# Patient Record
Sex: Male | Born: 1985 | Race: Black or African American | Hispanic: No
Health system: Southern US, Community
[De-identification: ages and names within clinical notes are randomized; demographics above are authoritative.]

---

## 2019-04-01 DIAGNOSIS — S066X9A Traumatic subarachnoid hemorrhage with loss of consciousness of unspecified duration, initial encounter: Secondary | ICD-10-CM | POA: Diagnosis not present

## 2019-04-01 DIAGNOSIS — S020XXA Fracture of vault of skull, initial encounter for closed fracture: Secondary | ICD-10-CM | POA: Diagnosis present

## 2019-04-01 DIAGNOSIS — I609 Nontraumatic subarachnoid hemorrhage, unspecified: Secondary | ICD-10-CM | POA: Diagnosis not present

## 2019-04-01 DIAGNOSIS — S0101XA Laceration without foreign body of scalp, initial encounter: Secondary | ICD-10-CM | POA: Diagnosis present

## 2019-04-01 DIAGNOSIS — S270XXA Traumatic pneumothorax, initial encounter: Secondary | ICD-10-CM | POA: Diagnosis present

## 2019-04-01 DIAGNOSIS — S2242XA Multiple fractures of ribs, left side, initial encounter for closed fracture: Secondary | ICD-10-CM | POA: Diagnosis present

## 2019-04-01 DIAGNOSIS — E875 Hyperkalemia: Secondary | ICD-10-CM | POA: Diagnosis not present

## 2019-04-01 DIAGNOSIS — Z20822 Contact with and (suspected) exposure to covid-19: Secondary | ICD-10-CM | POA: Diagnosis present

## 2019-04-01 DIAGNOSIS — R402213 Coma scale, best verbal response, none, at hospital admission: Secondary | ICD-10-CM | POA: Diagnosis not present

## 2019-04-01 DIAGNOSIS — Z915 Personal history of self-harm: Secondary | ICD-10-CM

## 2019-04-01 DIAGNOSIS — F430 Acute stress reaction: Secondary | ICD-10-CM | POA: Diagnosis present

## 2019-04-01 DIAGNOSIS — S0285XA Fracture of orbit, unspecified, initial encounter for closed fracture: Secondary | ICD-10-CM | POA: Diagnosis present

## 2019-04-01 DIAGNOSIS — M79672 Pain in left foot: Secondary | ICD-10-CM | POA: Diagnosis not present

## 2019-04-01 DIAGNOSIS — R1310 Dysphagia, unspecified: Secondary | ICD-10-CM | POA: Diagnosis present

## 2019-04-01 DIAGNOSIS — R402133 Coma scale, eyes open, to sound, at hospital admission: Secondary | ICD-10-CM | POA: Diagnosis not present

## 2019-04-01 DIAGNOSIS — S36039A Unspecified laceration of spleen, initial encounter: Secondary | ICD-10-CM | POA: Diagnosis present

## 2019-04-01 DIAGNOSIS — Y92230 Patient room in hospital as the place of occurrence of the external cause: Secondary | ICD-10-CM | POA: Diagnosis not present

## 2019-04-01 DIAGNOSIS — S0240DA Maxillary fracture, left side, initial encounter for closed fracture: Secondary | ICD-10-CM | POA: Diagnosis present

## 2019-04-01 DIAGNOSIS — S25412A Minor laceration of left pulmonary blood vessels, initial encounter: Secondary | ICD-10-CM | POA: Diagnosis present

## 2019-04-01 DIAGNOSIS — R440 Auditory hallucinations: Secondary | ICD-10-CM | POA: Diagnosis present

## 2019-04-01 DIAGNOSIS — S32302A Unspecified fracture of left ilium, initial encounter for closed fracture: Secondary | ICD-10-CM | POA: Diagnosis present

## 2019-04-01 DIAGNOSIS — D62 Acute posthemorrhagic anemia: Secondary | ICD-10-CM | POA: Diagnosis present

## 2019-04-01 DIAGNOSIS — R402412 Glasgow coma scale score 13-15, at arrival to emergency department: Secondary | ICD-10-CM | POA: Diagnosis present

## 2019-04-01 DIAGNOSIS — S27339A Laceration of lung, unspecified, initial encounter: Secondary | ICD-10-CM | POA: Diagnosis present

## 2019-04-01 DIAGNOSIS — W1830XA Fall on same level, unspecified, initial encounter: Secondary | ICD-10-CM | POA: Diagnosis not present

## 2019-04-01 DIAGNOSIS — G9389 Other specified disorders of brain: Secondary | ICD-10-CM | POA: Diagnosis present

## 2019-04-01 DIAGNOSIS — S27321A Contusion of lung, unilateral, initial encounter: Secondary | ICD-10-CM | POA: Diagnosis present

## 2019-04-01 DIAGNOSIS — M542 Cervicalgia: Secondary | ICD-10-CM | POA: Diagnosis not present

## 2019-04-01 DIAGNOSIS — E876 Hypokalemia: Secondary | ICD-10-CM | POA: Diagnosis not present

## 2019-04-01 DIAGNOSIS — S0240FA Zygomatic fracture, left side, initial encounter for closed fracture: Secondary | ICD-10-CM | POA: Diagnosis present

## 2019-04-01 DIAGNOSIS — R451 Restlessness and agitation: Secondary | ICD-10-CM | POA: Diagnosis not present

## 2019-04-01 DIAGNOSIS — S52302A Unspecified fracture of shaft of left radius, initial encounter for closed fracture: Secondary | ICD-10-CM | POA: Diagnosis present

## 2019-04-01 DIAGNOSIS — S42022A Displaced fracture of shaft of left clavicle, initial encounter for closed fracture: Secondary | ICD-10-CM | POA: Diagnosis present

## 2019-04-01 DIAGNOSIS — R339 Retention of urine, unspecified: Secondary | ICD-10-CM | POA: Diagnosis not present

## 2019-04-01 DIAGNOSIS — R402353 Coma scale, best motor response, localizes pain, at hospital admission: Secondary | ICD-10-CM | POA: Diagnosis not present

## 2019-04-02 ENCOUNTER — Other Ambulatory Visit: Payer: Self-pay

## 2019-04-02 ENCOUNTER — Emergency Department (HOSPITAL_COMMUNITY): Payer: No Typology Code available for payment source

## 2019-04-02 ENCOUNTER — Inpatient Hospital Stay: Payer: Self-pay

## 2019-04-02 ENCOUNTER — Encounter (HOSPITAL_COMMUNITY): Payer: Self-pay | Admitting: Emergency Medicine

## 2019-04-02 ENCOUNTER — Inpatient Hospital Stay (HOSPITAL_COMMUNITY)
Admission: EM | Admit: 2019-04-02 | Discharge: 2019-05-03 | DRG: 958 | Disposition: A | Discharge status: Home / Self Care | Payer: No Typology Code available for payment source | Attending: Surgery | Admitting: Surgery

## 2019-04-02 ENCOUNTER — Inpatient Hospital Stay (HOSPITAL_COMMUNITY): Payer: No Typology Code available for payment source

## 2019-04-02 DIAGNOSIS — Y92230 Patient room in hospital as the place of occurrence of the external cause: Secondary | ICD-10-CM | POA: Diagnosis not present

## 2019-04-02 DIAGNOSIS — S06369A Traumatic hemorrhage of cerebrum, unspecified, with loss of consciousness of unspecified duration, initial encounter: Secondary | ICD-10-CM

## 2019-04-02 DIAGNOSIS — R52 Pain, unspecified: Secondary | ICD-10-CM

## 2019-04-02 DIAGNOSIS — Z419 Encounter for procedure for purposes other than remedying health state, unspecified: Secondary | ICD-10-CM

## 2019-04-02 DIAGNOSIS — W19XXXA Unspecified fall, initial encounter: Secondary | ICD-10-CM

## 2019-04-02 DIAGNOSIS — S020XXA Fracture of vault of skull, initial encounter for closed fracture: Secondary | ICD-10-CM | POA: Diagnosis present

## 2019-04-02 DIAGNOSIS — S25412A Minor laceration of left pulmonary blood vessels, initial encounter: Secondary | ICD-10-CM | POA: Diagnosis present

## 2019-04-02 DIAGNOSIS — G9389 Other specified disorders of brain: Secondary | ICD-10-CM | POA: Diagnosis present

## 2019-04-02 DIAGNOSIS — D62 Acute posthemorrhagic anemia: Secondary | ICD-10-CM | POA: Diagnosis present

## 2019-04-02 DIAGNOSIS — S270XXA Traumatic pneumothorax, initial encounter: Secondary | ICD-10-CM | POA: Diagnosis present

## 2019-04-02 DIAGNOSIS — Z0189 Encounter for other specified special examinations: Secondary | ICD-10-CM

## 2019-04-02 DIAGNOSIS — S32302A Unspecified fracture of left ilium, initial encounter for closed fracture: Secondary | ICD-10-CM | POA: Diagnosis present

## 2019-04-02 DIAGNOSIS — S066X9A Traumatic subarachnoid hemorrhage with loss of consciousness of unspecified duration, initial encounter: Secondary | ICD-10-CM | POA: Diagnosis present

## 2019-04-02 DIAGNOSIS — S36039A Unspecified laceration of spleen, initial encounter: Secondary | ICD-10-CM | POA: Diagnosis present

## 2019-04-02 DIAGNOSIS — R402133 Coma scale, eyes open, to sound, at hospital admission: Secondary | ICD-10-CM | POA: Diagnosis not present

## 2019-04-02 DIAGNOSIS — I619 Nontraumatic intracerebral hemorrhage, unspecified: Secondary | ICD-10-CM | POA: Diagnosis present

## 2019-04-02 DIAGNOSIS — S42002A Fracture of unspecified part of left clavicle, initial encounter for closed fracture: Secondary | ICD-10-CM

## 2019-04-02 DIAGNOSIS — S32309A Unspecified fracture of unspecified ilium, initial encounter for closed fracture: Secondary | ICD-10-CM

## 2019-04-02 DIAGNOSIS — I609 Nontraumatic subarachnoid hemorrhage, unspecified: Secondary | ICD-10-CM | POA: Diagnosis present

## 2019-04-02 DIAGNOSIS — Z789 Other specified health status: Secondary | ICD-10-CM

## 2019-04-02 DIAGNOSIS — R1310 Dysphagia, unspecified: Secondary | ICD-10-CM | POA: Diagnosis present

## 2019-04-02 DIAGNOSIS — Z915 Personal history of self-harm: Secondary | ICD-10-CM | POA: Diagnosis not present

## 2019-04-02 DIAGNOSIS — R339 Retention of urine, unspecified: Secondary | ICD-10-CM | POA: Diagnosis not present

## 2019-04-02 DIAGNOSIS — Z20822 Contact with and (suspected) exposure to covid-19: Secondary | ICD-10-CM | POA: Diagnosis present

## 2019-04-02 DIAGNOSIS — S0101XA Laceration without foreign body of scalp, initial encounter: Secondary | ICD-10-CM | POA: Diagnosis present

## 2019-04-02 DIAGNOSIS — R440 Auditory hallucinations: Secondary | ICD-10-CM | POA: Diagnosis present

## 2019-04-02 DIAGNOSIS — S0285XA Fracture of orbit, unspecified, initial encounter for closed fracture: Secondary | ICD-10-CM | POA: Diagnosis present

## 2019-04-02 DIAGNOSIS — M79671 Pain in right foot: Secondary | ICD-10-CM

## 2019-04-02 DIAGNOSIS — S5290XA Unspecified fracture of unspecified forearm, initial encounter for closed fracture: Secondary | ICD-10-CM

## 2019-04-02 DIAGNOSIS — S5292XA Unspecified fracture of left forearm, initial encounter for closed fracture: Secondary | ICD-10-CM

## 2019-04-02 DIAGNOSIS — S27339A Laceration of lung, unspecified, initial encounter: Secondary | ICD-10-CM | POA: Diagnosis present

## 2019-04-02 DIAGNOSIS — S25409A Unspecified injury of unspecified pulmonary blood vessels, initial encounter: Secondary | ICD-10-CM

## 2019-04-02 DIAGNOSIS — R402412 Glasgow coma scale score 13-15, at arrival to emergency department: Secondary | ICD-10-CM | POA: Diagnosis present

## 2019-04-02 DIAGNOSIS — R402353 Coma scale, best motor response, localizes pain, at hospital admission: Secondary | ICD-10-CM | POA: Diagnosis not present

## 2019-04-02 DIAGNOSIS — S299XXA Unspecified injury of thorax, initial encounter: Secondary | ICD-10-CM

## 2019-04-02 DIAGNOSIS — S27321A Contusion of lung, unilateral, initial encounter: Secondary | ICD-10-CM | POA: Diagnosis present

## 2019-04-02 DIAGNOSIS — S2242XA Multiple fractures of ribs, left side, initial encounter for closed fracture: Secondary | ICD-10-CM | POA: Diagnosis present

## 2019-04-02 DIAGNOSIS — J939 Pneumothorax, unspecified: Secondary | ICD-10-CM

## 2019-04-02 DIAGNOSIS — S0240FA Zygomatic fracture, left side, initial encounter for closed fracture: Secondary | ICD-10-CM | POA: Diagnosis present

## 2019-04-02 DIAGNOSIS — S52302A Unspecified fracture of shaft of left radius, initial encounter for closed fracture: Secondary | ICD-10-CM | POA: Diagnosis present

## 2019-04-02 DIAGNOSIS — W1830XA Fall on same level, unspecified, initial encounter: Secondary | ICD-10-CM | POA: Diagnosis not present

## 2019-04-02 DIAGNOSIS — S0240DA Maxillary fracture, left side, initial encounter for closed fracture: Secondary | ICD-10-CM | POA: Diagnosis present

## 2019-04-02 DIAGNOSIS — F430 Acute stress reaction: Secondary | ICD-10-CM

## 2019-04-02 HISTORY — PX: IR EMBO ART  VEN HEMORR LYMPH EXTRAV  INC GUIDE ROADMAPPING: IMG5450

## 2019-04-02 HISTORY — PX: IR ANGIOGRAM SELECTIVE EACH ADDITIONAL VESSEL: IMG667

## 2019-04-02 HISTORY — PX: IR US GUIDE VASC ACCESS RIGHT: IMG2390

## 2019-04-02 HISTORY — PX: IR ANGIOGRAM VISCERAL SELECTIVE: IMG657

## 2019-04-02 LAB — CBC
HCT: 20.4 % — ABNORMAL LOW (ref 39.0–52.0)
HCT: 22.7 % — ABNORMAL LOW (ref 39.0–52.0)
HCT: 33.4 % — ABNORMAL LOW (ref 39.0–52.0)
Hemoglobin: 11.1 g/dL — ABNORMAL LOW (ref 13.0–17.0)
Hemoglobin: 7.1 g/dL — ABNORMAL LOW (ref 13.0–17.0)
Hemoglobin: 7.7 g/dL — ABNORMAL LOW (ref 13.0–17.0)
MCH: 28 pg (ref 26.0–34.0)
MCH: 28 pg (ref 26.0–34.0)
MCH: 28.1 pg (ref 26.0–34.0)
MCHC: 33.2 g/dL (ref 30.0–36.0)
MCHC: 33.9 g/dL (ref 30.0–36.0)
MCHC: 34.8 g/dL (ref 30.0–36.0)
MCV: 80.6 fL (ref 80.0–100.0)
MCV: 82.5 fL (ref 80.0–100.0)
MCV: 84.3 fL (ref 80.0–100.0)
Platelets: 162 10*3/uL (ref 150–400)
Platelets: 172 10*3/uL (ref 150–400)
Platelets: 360 10*3/uL (ref 150–400)
RBC: 2.53 MIL/uL — ABNORMAL LOW (ref 4.22–5.81)
RBC: 2.75 MIL/uL — ABNORMAL LOW (ref 4.22–5.81)
RBC: 3.96 MIL/uL — ABNORMAL LOW (ref 4.22–5.81)
RDW: 13.2 % (ref 11.5–15.5)
RDW: 13.6 % (ref 11.5–15.5)
RDW: 13.8 % (ref 11.5–15.5)
WBC: 10.7 10*3/uL — ABNORMAL HIGH (ref 4.0–10.5)
WBC: 10.8 10*3/uL — ABNORMAL HIGH (ref 4.0–10.5)
WBC: 12.7 10*3/uL — ABNORMAL HIGH (ref 4.0–10.5)
nRBC: 0 % (ref 0.0–0.2)
nRBC: 0 % (ref 0.0–0.2)
nRBC: 0 % (ref 0.0–0.2)

## 2019-04-02 LAB — POCT I-STAT 7, (LYTES, BLD GAS, ICA,H+H)
Acid-base deficit: 2 mmol/L (ref 0.0–2.0)
Bicarbonate: 25.2 mmol/L (ref 20.0–28.0)
Calcium, Ion: 1.2 mmol/L (ref 1.15–1.40)
HCT: 30 % — ABNORMAL LOW (ref 39.0–52.0)
Hemoglobin: 10.2 g/dL — ABNORMAL LOW (ref 13.0–17.0)
O2 Saturation: 99 %
Patient temperature: 98.6
Potassium: 3.3 mmol/L — ABNORMAL LOW (ref 3.5–5.1)
Sodium: 141 mmol/L (ref 135–145)
TCO2: 27 mmol/L (ref 22–32)
pCO2 arterial: 52 mmHg — ABNORMAL HIGH (ref 32.0–48.0)
pH, Arterial: 7.293 — ABNORMAL LOW (ref 7.350–7.450)
pO2, Arterial: 175 mmHg — ABNORMAL HIGH (ref 83.0–108.0)

## 2019-04-02 LAB — COMPREHENSIVE METABOLIC PANEL
ALT: 94 U/L — ABNORMAL HIGH (ref 0–44)
AST: 242 U/L — ABNORMAL HIGH (ref 15–41)
Albumin: 3.4 g/dL — ABNORMAL LOW (ref 3.5–5.0)
Alkaline Phosphatase: 71 U/L (ref 38–126)
Anion gap: 11 (ref 5–15)
BUN: 17 mg/dL (ref 6–20)
CO2: 22 mmol/L (ref 22–32)
Calcium: 8.5 mg/dL — ABNORMAL LOW (ref 8.9–10.3)
Chloride: 105 mmol/L (ref 98–111)
Creatinine, Ser: 1.11 mg/dL (ref 0.61–1.24)
GFR calc Af Amer: >60 mL/min (ref >60)
GFR calc non Af Amer: >60 mL/min (ref >60)
Glucose, Bld: 244 mg/dL — ABNORMAL HIGH (ref 70–99)
Potassium: 4.2 mmol/L (ref 3.5–5.1)
Sodium: 138 mmol/L (ref 135–145)
Total Bilirubin: 0.6 mg/dL (ref 0.3–1.2)
Total Protein: 5.9 g/dL — ABNORMAL LOW (ref 6.5–8.1)

## 2019-04-02 LAB — ETHANOL: Alcohol, Ethyl (B): <10 mg/dL (ref <10)

## 2019-04-02 LAB — MRSA PCR SCREENING: MRSA by PCR: NEGATIVE

## 2019-04-02 LAB — RESPIRATORY PANEL BY RT PCR (FLU A&B, COVID)
Influenza A by PCR: NEGATIVE
Influenza B by PCR: NEGATIVE
SARS Coronavirus 2 by RT PCR: NEGATIVE

## 2019-04-02 LAB — PROTIME-INR
INR: 1.1 (ref 0.8–1.2)
Prothrombin Time: 13.6 seconds (ref 11.4–15.2)

## 2019-04-02 LAB — TRIGLYCERIDES: Triglycerides: 45 mg/dL (ref <150)

## 2019-04-02 LAB — LACTIC ACID, PLASMA: Lactic Acid, Venous: 3 mmol/L (ref 0.5–1.9)

## 2019-04-02 LAB — CDS SEROLOGY

## 2019-04-02 LAB — ABO/RH: ABO/RH(D): O POS

## 2019-04-02 LAB — HIV ANTIBODY (ROUTINE TESTING W REFLEX): HIV Screen 4th Generation wRfx: NONREACTIVE

## 2019-04-02 MED ORDER — MIDAZOLAM HCL 2 MG/2ML IJ SOLN
INTRAMUSCULAR | Status: AC
  Filled 2019-04-02: qty 2

## 2019-04-02 MED ORDER — SODIUM CHLORIDE 0.9 % IV BOLUS (SEPSIS)
1000.0000 mL | Freq: Once | INTRAVENOUS | Status: AC
  Administered 2019-04-02: 02:00:00 1000 mL via INTRAVENOUS

## 2019-04-02 MED ORDER — DOCUSATE SODIUM 50 MG/5ML PO LIQD
100.0000 mg | Freq: Every day | ORAL | Status: DC
  Administered 2019-04-02 – 2019-04-09 (×5): 100 mg
  Filled 2019-04-02 (×6): qty 10

## 2019-04-02 MED ORDER — ORAL CARE MOUTH RINSE
15.0000 mL | OROMUCOSAL | Status: DC
  Administered 2019-04-02 – 2019-04-07 (×45): 15 mL via OROMUCOSAL

## 2019-04-02 MED ORDER — CHLORHEXIDINE GLUCONATE CLOTH 2 % EX PADS
6.0000 | MEDICATED_PAD | Freq: Every day | CUTANEOUS | Status: DC
  Administered 2019-04-03 – 2019-04-05 (×3): 6 via TOPICAL

## 2019-04-02 MED ORDER — LACTATED RINGERS IV BOLUS
1000.0000 mL | Freq: Once | INTRAVENOUS | Status: AC
  Administered 2019-04-02: 06:00:00 1000 mL via INTRAVENOUS

## 2019-04-02 MED ORDER — SODIUM CHLORIDE 0.9% FLUSH
10.0000 mL | Freq: Two times a day (BID) | INTRAVENOUS | Status: DC
  Administered 2019-04-02 – 2019-04-05 (×6): 10 mL
  Administered 2019-04-06: 20 mL
  Administered 2019-04-06 – 2019-04-12 (×5): 10 mL
  Administered 2019-04-14 (×2): 3 mL
  Administered 2019-04-16 – 2019-04-28 (×4): 10 mL

## 2019-04-02 MED ORDER — LIDOCAINE HCL 1 % IJ SOLN
INTRAMUSCULAR | Status: AC
  Filled 2019-04-02: qty 20

## 2019-04-02 MED ORDER — MIDAZOLAM HCL 2 MG/2ML IJ SOLN
INTRAMUSCULAR | Status: AC
  Administered 2019-04-02: 2 mg via INTRAVENOUS
  Filled 2019-04-02: qty 2

## 2019-04-02 MED ORDER — DOCUSATE SODIUM 100 MG PO CAPS: 100.0000 mg | ORAL_CAPSULE | Freq: Two times a day (BID) | ORAL | Status: DC

## 2019-04-02 MED ORDER — PROPOFOL 1000 MG/100ML IV EMUL: 5.0000 ug/kg/min | INTRAVENOUS | Status: DC

## 2019-04-02 MED ORDER — MIDAZOLAM HCL 2 MG/2ML IJ SOLN
2.0000 mg | Freq: Once | INTRAMUSCULAR | Status: AC
  Administered 2019-04-02: 2 mg via INTRAVENOUS

## 2019-04-02 MED ORDER — ONDANSETRON 4 MG PO TBDP: 4.0000 mg | ORAL_TABLET | Freq: Four times a day (QID) | ORAL | Status: DC | PRN

## 2019-04-02 MED ORDER — CEFAZOLIN SODIUM-DEXTROSE 2-4 GM/100ML-% IV SOLN: 2.0000 g | Freq: Three times a day (TID) | INTRAVENOUS | Status: DC

## 2019-04-02 MED ORDER — FENTANYL CITRATE (PF) 100 MCG/2ML IJ SOLN: 50.0000 ug | INTRAMUSCULAR | Status: DC | PRN

## 2019-04-02 MED ORDER — TETANUS-DIPHTH-ACELL PERTUSSIS 5-2.5-18.5 LF-MCG/0.5 IM SUSP: 0.5000 mL | Freq: Once | INTRAMUSCULAR | Status: DC

## 2019-04-02 MED ORDER — CEFAZOLIN SODIUM-DEXTROSE 2-4 GM/100ML-% IV SOLN
2.0000 g | Freq: Once | INTRAVENOUS | Status: AC
  Administered 2019-04-03: 16:00:00 2 g via INTRAVENOUS

## 2019-04-02 MED ORDER — LEVETIRACETAM IN NACL 1000 MG/100ML IV SOLN
1000.0000 mg | Freq: Once | INTRAVENOUS | Status: AC
  Administered 2019-04-02: 1000 mg via INTRAVENOUS
  Filled 2019-04-02: qty 100

## 2019-04-02 MED ORDER — LACTATED RINGERS IV BOLUS
1000.0000 mL | Freq: Once | INTRAVENOUS | Status: AC
  Administered 2019-04-02: 1000 mL via INTRAVENOUS

## 2019-04-02 MED ORDER — CHLORHEXIDINE GLUCONATE 0.12% ORAL RINSE (MEDLINE KIT)
15.0000 mL | Freq: Two times a day (BID) | OROMUCOSAL | Status: DC
  Administered 2019-04-02 – 2019-04-17 (×25): 15 mL via OROMUCOSAL

## 2019-04-02 MED ORDER — LIDOCAINE-EPINEPHRINE (PF) 2 %-1:200000 IJ SOLN
20.0000 mL | Freq: Once | INTRAMUSCULAR | Status: AC
  Administered 2019-04-02: 20 mL
  Filled 2019-04-02: qty 20

## 2019-04-02 MED ORDER — SODIUM CHLORIDE 0.9% FLUSH: 10.0000 mL | INTRAVENOUS | Status: DC | PRN

## 2019-04-02 MED ORDER — FENTANYL CITRATE (PF) 100 MCG/2ML IJ SOLN: 100.0000 ug | Freq: Once | INTRAMUSCULAR | Status: AC

## 2019-04-02 MED ORDER — FENTANYL 2500MCG IN NS 250ML (10MCG/ML) PREMIX INFUSION
50.0000 ug/h | INTRAVENOUS | Status: DC
  Administered 2019-04-02: 50 ug/h via INTRAVENOUS
  Administered 2019-04-03: 125 ug/h via INTRAVENOUS
  Administered 2019-04-04: 13:00:00 150 ug/h via INTRAVENOUS
  Administered 2019-04-05: 18:00:00 100 ug/h via INTRAVENOUS
  Administered 2019-04-05: 03:00:00 200 ug/h via INTRAVENOUS
  Filled 2019-04-02 (×6): qty 250

## 2019-04-02 MED ORDER — FENTANYL CITRATE (PF) 100 MCG/2ML IJ SOLN
50.0000 ug | INTRAMUSCULAR | Status: DC | PRN
  Filled 2019-04-02: qty 2

## 2019-04-02 MED ORDER — IOHEXOL 300 MG/ML  SOLN
150.0000 mL | Freq: Once | INTRAMUSCULAR | Status: AC | PRN
  Administered 2019-04-02: 15 mL via INTRA_ARTERIAL

## 2019-04-02 MED ORDER — IOHEXOL 300 MG/ML  SOLN
100.0000 mL | Freq: Once | INTRAMUSCULAR | Status: AC | PRN
  Administered 2019-04-02: 01:00:00 100 mL via INTRAVENOUS

## 2019-04-02 MED ORDER — FENTANYL CITRATE (PF) 100 MCG/2ML IJ SOLN
INTRAMUSCULAR | Status: AC
  Filled 2019-04-02: qty 2

## 2019-04-02 MED ORDER — FENTANYL CITRATE (PF) 100 MCG/2ML IJ SOLN
100.0000 ug | Freq: Once | INTRAMUSCULAR | Status: AC
  Administered 2019-04-02: 100 ug via INTRAVENOUS

## 2019-04-02 MED ORDER — FENTANYL CITRATE (PF) 100 MCG/2ML IJ SOLN
INTRAMUSCULAR | Status: AC
  Administered 2019-04-02: 100 ug via INTRAVENOUS
  Filled 2019-04-02: qty 2

## 2019-04-02 MED ORDER — PANTOPRAZOLE SODIUM 40 MG IV SOLR
40.0000 mg | Freq: Every day | INTRAVENOUS | Status: DC
  Administered 2019-04-02 – 2019-04-07 (×6): 40 mg via INTRAVENOUS
  Filled 2019-04-02 (×7): qty 40

## 2019-04-02 MED ORDER — LEVETIRACETAM IN NACL 500 MG/100ML IV SOLN
500.0000 mg | Freq: Two times a day (BID) | INTRAVENOUS | Status: AC
  Administered 2019-04-02 – 2019-04-09 (×14): 500 mg via INTRAVENOUS
  Filled 2019-04-02 (×14): qty 100

## 2019-04-02 MED ORDER — ONDANSETRON HCL 4 MG/2ML IJ SOLN: 4.0000 mg | Freq: Four times a day (QID) | INTRAMUSCULAR | Status: DC | PRN

## 2019-04-02 MED ORDER — PROPOFOL 1000 MG/100ML IV EMUL
0.0000 ug/kg/min | INTRAVENOUS | Status: DC
  Administered 2019-04-02: 03:00:00 10 ug/kg/min via INTRAVENOUS
  Administered 2019-04-02: 03:00:00 50 ug/kg/min via INTRAVENOUS
  Administered 2019-04-02: 40.135 ug/kg/min via INTRAVENOUS
  Administered 2019-04-03 (×2): 40 ug/kg/min via INTRAVENOUS
  Administered 2019-04-03 (×2): 50 ug/kg/min via INTRAVENOUS
  Administered 2019-04-04: 40 ug/kg/min via INTRAVENOUS
  Administered 2019-04-04 – 2019-04-05 (×3): 35 ug/kg/min via INTRAVENOUS
  Administered 2019-04-05 (×2): 30 ug/kg/min via INTRAVENOUS
  Administered 2019-04-06: 25 ug/kg/min via INTRAVENOUS
  Filled 2019-04-02 (×13): qty 100

## 2019-04-02 MED ORDER — PROPOFOL 1000 MG/100ML IV EMUL
INTRAVENOUS | Status: AC
  Filled 2019-04-02: qty 100

## 2019-04-02 MED ORDER — IOHEXOL 300 MG/ML  SOLN
150.0000 mL | Freq: Once | INTRAMUSCULAR | Status: AC | PRN
  Administered 2019-04-02: 10 mL via INTRA_ARTERIAL

## 2019-04-02 MED ORDER — FENTANYL BOLUS VIA INFUSION
50.0000 ug | INTRAVENOUS | Status: DC | PRN
  Administered 2019-04-06 (×4): 50 ug via INTRAVENOUS
  Filled 2019-04-02: qty 50

## 2019-04-02 MED ORDER — MIDAZOLAM HCL 2 MG/2ML IJ SOLN: 2.0000 mg | Freq: Once | INTRAMUSCULAR | Status: AC

## 2019-04-02 MED ORDER — PANTOPRAZOLE SODIUM 40 MG PO TBEC
40.0000 mg | DELAYED_RELEASE_TABLET | Freq: Every day | ORAL | Status: DC
  Filled 2019-04-02: qty 1

## 2019-04-02 MED ORDER — FENTANYL CITRATE (PF) 100 MCG/2ML IJ SOLN: 50.0000 ug | Freq: Once | INTRAMUSCULAR | Status: DC

## 2019-04-02 MED ORDER — LACTATED RINGERS IV SOLN: INTRAVENOUS | Status: DC

## 2019-04-02 NOTE — ED Notes (Signed)
Dr. Jenkins at bedside. 
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
## 2019-04-02 NOTE — Progress Notes (Addendum)
Noted to have closed L BBFFx which is provisionally splinted.  Will plan on ORIF either later this  AM (approx 10am) vs Monday afternoon (tentatively scheduled for 2pm) depending upon limb's neurovascular status.  Recommendations: 1. Splint and elevate limb without flexing elbow 2. Monitor neurovascular status Q1 hour for now  Neil Crouch, MD Hand Surgery Mobile [phone]

## 2019-04-02 NOTE — ED Notes (Signed)
Dr. Landau at bedside  

## 2019-04-02 NOTE — Progress Notes (Signed)
Orthopedic Tech Progress Note Patient Details:  John Nielsen. Jul 31, 1985 [id number]  Ortho Devices Type of Ortho Device: Sugartong splint Ortho Device/Splint Location: applied new splint after old splin t was removed for inspection of arm. Ortho Device/Splint Interventions: Ordered, Application, Adjustment   Post Interventions Patient Tolerated: Well Instructions Provided: Care of device, Adjustment of device   Trinna Post 04/02/2019, 1:47 AM

## 2019-04-02 NOTE — Progress Notes (Signed)
IR Brief Note:  Request also made for central venous catheter placement.  This will be placed at the time of splenic embolization procedure in IR.   Dr. Deanne Coffer and Trauma surgery in agreement.   Emergent consent to reflect all planned procedure.   Loyce Dys, MS RD PA-C 11:33 AM

## 2019-04-02 NOTE — Procedures (Signed)
R arm TL PowerPICC placed under [place] and fluoroscopy No ptx on spot chest radiograph. No complication No blood loss. See complete dictation in [facility].   Marland Kitchen
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
## 2019-04-02 NOTE — Progress Notes (Signed)
Spoke with Candise Bowens RN re PICC line placement.  States is to be transferred to IR soon for procedure.  Recommended to have IR place PICC while there if able.  Pt also for surgery today. Will contact Jen later this shift re PICC placement.
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
## 2019-04-02 NOTE — ED Provider Notes (Signed)
      Zadie Rhine, MD 04/02/19 [phone]
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
## 2019-04-02 NOTE — Progress Notes (Signed)
No family able to be located at this time, any procedures will need to be done under emergency consent until family is located.  John Nielsen , [facility] 04/02/2019, 7:54 AM Please see Amion for pager number during day hours 7:00am-4:30pm

## 2019-04-02 NOTE — Progress Notes (Signed)
Orthopedic Tech Progress Note Patient Details:  John Nielsen. 06-Aug-1985 [id number]  Ortho Devices Type of Ortho Device: Sugartong splint Ortho Device/Splint Location: lue. i applied a splint at drs request. he requested a quick splint to stabilize the arm that can easily be removed later for inspection of arm. Ortho Device/Splint Interventions: Ordered, Application, Adjustment   Post Interventions Patient Tolerated: Well Instructions Provided: Care of device, Adjustment of device   Trinna Post 04/02/2019, 1:38 AM
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
## 2019-04-02 NOTE — Sedation Documentation (Signed)
Pt is on ventilator and accompanied and monitored by RRT. Pt is also sedated / propofol & fentanyl drips running. Writer will remain in room and continue to monitor and record VS throughout procedure
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
## 2019-04-02 NOTE — Procedures (Signed)
   Procedure Note  Date: 04/02/2019  Procedure: tube thoracostomy--left    Pre-op diagnosis: left pneumothorax  Post-op diagnosis: same  Surgeon: Diamantina Monks, MD  Anesthesia: local   EBL: <5cc procedural; 0cc evacuated Drains/Implants: 74F chest tube Specimen: none  Description of procedure: This procedure was performed emergently and therefore informed consent was not obtained.  A small skin nick was made at the fourth intercostal space. An introducer needle was inserted and a guidewire inserted through the needle. The needle was removed and the tract dilated. The chest tube was inserted over the guidewire and the guidewire removed.   The tube was secured at the skin with suture and connected to an atrium at -20cm water wall suction. Immediate output from the chest tube was 0cc. The site was dressed with gauze and tape. The patient tolerated the procedure well. There were no complications. Follow up chest x-ray was ordered to confirm tube positioning, complete evacuation, and complete lung re-expansion.    Diamantina Monks, MD General and Trauma Surgery [facility] Surgery

## 2019-04-02 NOTE — ED Notes (Signed)
Mia, Pa at bedside suturing head lac
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
## 2019-04-02 NOTE — Progress Notes (Signed)
Orthopedic Tech Progress Note Patient Details:  John Nielsen. [birth year] [id number]  Ortho Devices Type of Ortho Device: Post (long arm) splint Ortho Device/Splint Location: lue. applied a new splint ar directed to in order from Dr. Janee Morn. Ortho Device/Splint Interventions: Ordered, Application, Adjustment   Post Interventions Patient Tolerated: Well Instructions Provided: Care of device, Adjustment of device   Trinna Post 04/02/2019, 6:35 AM

## 2019-04-03 ENCOUNTER — Inpatient Hospital Stay (HOSPITAL_COMMUNITY): Payer: No Typology Code available for payment source | Admitting: Certified Registered"

## 2019-04-03 ENCOUNTER — Inpatient Hospital Stay (HOSPITAL_COMMUNITY): Payer: No Typology Code available for payment source

## 2019-04-03 ENCOUNTER — Encounter (HOSPITAL_COMMUNITY): Admission: EM | Disposition: A | Discharge status: Home / Self Care | Payer: Self-pay | Source: Home / Self Care

## 2019-04-03 HISTORY — PX: ORIF RADIAL FRACTURE: SHX5113

## 2019-04-03 LAB — CBC
HCT: 21.5 % — ABNORMAL LOW (ref 39.0–52.0)
Hemoglobin: 7.3 g/dL — ABNORMAL LOW (ref 13.0–17.0)
MCH: 27.5 pg (ref 26.0–34.0)
MCHC: 34 g/dL (ref 30.0–36.0)
MCV: 81.1 fL (ref 80.0–100.0)
Platelets: 154 10*3/uL (ref 150–400)
RBC: 2.65 MIL/uL — ABNORMAL LOW (ref 4.22–5.81)
RDW: 13.7 % (ref 11.5–15.5)
WBC: 9.3 10*3/uL (ref 4.0–10.5)
nRBC: 0 % (ref 0.0–0.2)

## 2019-04-03 LAB — CBC WITH DIFFERENTIAL/PLATELET
Abs Immature Granulocytes: 0.04 10*3/uL (ref 0.00–0.07)
Basophils Absolute: 0 10*3/uL (ref 0.0–0.1)
Basophils Relative: 0 %
Eosinophils Absolute: 0.1 10*3/uL (ref 0.0–0.5)
Eosinophils Relative: 1 %
HCT: 18.3 % — ABNORMAL LOW (ref 39.0–52.0)
Hemoglobin: 6.2 g/dL — CL (ref 13.0–17.0)
Immature Granulocytes: 0 %
Lymphocytes Relative: 21 %
Lymphs Abs: 2.4 10*3/uL (ref 0.7–4.0)
MCH: 28.2 pg (ref 26.0–34.0)
MCHC: 33.9 g/dL (ref 30.0–36.0)
MCV: 83.2 fL (ref 80.0–100.0)
Monocytes Absolute: 1 10*3/uL (ref 0.1–1.0)
Monocytes Relative: 9 %
Neutro Abs: 7.6 10*3/uL (ref 1.7–7.7)
Neutrophils Relative %: 69 %
Platelets: 156 10*3/uL (ref 150–400)
RBC: 2.2 MIL/uL — ABNORMAL LOW (ref 4.22–5.81)
RDW: 13.8 % (ref 11.5–15.5)
WBC: 11.2 10*3/uL — ABNORMAL HIGH (ref 4.0–10.5)
nRBC: 0 % (ref 0.0–0.2)

## 2019-04-03 LAB — HEMOGLOBIN AND HEMATOCRIT, BLOOD
HCT: 24 % — ABNORMAL LOW (ref 39.0–52.0)
Hemoglobin: 8.6 g/dL — ABNORMAL LOW (ref 13.0–17.0)

## 2019-04-03 LAB — MAGNESIUM: Magnesium: 1.6 mg/dL — ABNORMAL LOW (ref 1.7–2.4)

## 2019-04-03 LAB — PREPARE RBC (CROSSMATCH)

## 2019-04-03 LAB — BASIC METABOLIC PANEL
Anion gap: 6 (ref 5–15)
BUN: 11 mg/dL (ref 6–20)
CO2: 24 mmol/L (ref 22–32)
Calcium: 7.6 mg/dL — ABNORMAL LOW (ref 8.9–10.3)
Chloride: 109 mmol/L (ref 98–111)
Creatinine, Ser: 0.8 mg/dL (ref 0.61–1.24)
GFR calc Af Amer: >60 mL/min (ref >60)
GFR calc non Af Amer: >60 mL/min (ref >60)
Glucose, Bld: 97 mg/dL (ref 70–99)
Potassium: 4 mmol/L (ref 3.5–5.1)
Sodium: 139 mmol/L (ref 135–145)

## 2019-04-03 LAB — TRIGLYCERIDES: Triglycerides: 66 mg/dL (ref <150)

## 2019-04-03 LAB — PHOSPHORUS: Phosphorus: 2.8 mg/dL (ref 2.5–4.6)

## 2019-04-03 SURGERY — OPEN REDUCTION INTERNAL FIXATION (ORIF) RADIAL FRACTURE
Anesthesia: General | Laterality: Left

## 2019-04-03 MED ORDER — POTASSIUM CHLORIDE IN NACL 20-0.9 MEQ/L-% IV SOLN
INTRAVENOUS | Status: DC
  Filled 2019-04-03 (×4): qty 1000

## 2019-04-03 MED ORDER — ROCURONIUM BROMIDE 10 MG/ML (PF) SYRINGE
PREFILLED_SYRINGE | INTRAVENOUS | Status: DC | PRN
  Administered 2019-04-03 (×3): 50 mg via INTRAVENOUS

## 2019-04-03 MED ORDER — PROPOFOL 10 MG/ML IV BOLUS
INTRAVENOUS | Status: DC | PRN
  Administered 2019-04-03: 40 mg via INTRAVENOUS

## 2019-04-03 MED ORDER — 0.9 % SODIUM CHLORIDE (POUR BTL) OPTIME
TOPICAL | Status: DC | PRN
  Administered 2019-04-03: 1000 mL

## 2019-04-03 MED ORDER — MIDAZOLAM HCL 5 MG/5ML IJ SOLN
INTRAMUSCULAR | Status: DC | PRN
  Administered 2019-04-03: 2 mg via INTRAVENOUS

## 2019-04-03 MED ORDER — SODIUM CHLORIDE 0.9 % IV SOLN
INTRAVENOUS | Status: DC | PRN
  Administered 2019-04-03: 08:00:00 500 mL via INTRAVENOUS
  Administered 2019-04-11: 250 mL via INTRAVENOUS

## 2019-04-03 MED ORDER — PHENYLEPHRINE HCL-NACL 10-0.9 MG/250ML-% IV SOLN
INTRAVENOUS | Status: DC | PRN
  Administered 2019-04-03: 50 ug/min via INTRAVENOUS

## 2019-04-03 MED ORDER — SODIUM CHLORIDE 0.9 % IV SOLN: INTRAVENOUS | Status: DC

## 2019-04-03 MED ORDER — CALCIUM GLUCONATE-NACL 1-0.675 GM/50ML-% IV SOLN
1.0000 g | Freq: Once | INTRAVENOUS | Status: AC
  Administered 2019-04-03: 08:00:00 1000 mg via INTRAVENOUS
  Filled 2019-04-03: qty 50

## 2019-04-03 MED ORDER — SODIUM CHLORIDE 0.9% IV SOLUTION
Freq: Once | INTRAVENOUS | Status: AC
  Administered 2019-04-03: 500 mL via INTRAVENOUS

## 2019-04-03 MED ORDER — ALBUMIN HUMAN 5 % IV SOLN: INTRAVENOUS | Status: DC | PRN

## 2019-04-03 MED ORDER — MIDAZOLAM HCL 2 MG/2ML IJ SOLN
INTRAMUSCULAR | Status: AC
  Filled 2019-04-03: qty 2

## 2019-04-03 MED ORDER — SODIUM CHLORIDE 0.9% IV SOLUTION: Freq: Once | INTRAVENOUS | Status: AC

## 2019-04-03 MED ORDER — LACTATED RINGERS IV SOLN: INTRAVENOUS | Status: DC | PRN

## 2019-04-03 MED ORDER — BUPIVACAINE HCL (PF) 0.25 % IJ SOLN
INTRAMUSCULAR | Status: AC
  Filled 2019-04-03: qty 30

## 2019-04-03 MED ORDER — FENTANYL CITRATE (PF) 250 MCG/5ML IJ SOLN
INTRAMUSCULAR | Status: AC
  Filled 2019-04-03: qty 5

## 2019-04-03 MED ORDER — LIDOCAINE HCL (PF) 1 % IJ SOLN
INTRAMUSCULAR | Status: AC
  Filled 2019-04-03: qty 30

## 2019-04-03 SURGICAL SUPPLY — 60 items
BIT DRILL 2.5X2.75 QC CALB (BIT) ×3 IMPLANT
BIT DRILL 3.5 (BIT) ×1
BIT DRILL 3.5MM (BIT) ×1 IMPLANT
BIT DRILL CALIBRATED 2.7 (BIT) ×2 IMPLANT
BIT DRILL CALIBRATED 2.7MM (BIT) ×1
BIT DRILL WIN 3.0 (BIT) ×2 IMPLANT
BIT DRILL WIN 3.0MM (BIT) ×1
BLADE MINI RND TIP GREEN BEAV (BLADE) IMPLANT
BLADE SURG 15 STRL LF DISP TIS (BLADE) ×1 IMPLANT
BLADE SURG 15 STRL SS (BLADE) ×2
BNDG COHESIVE 2X5 TAN STRL LF (GAUZE/BANDAGES/DRESSINGS) IMPLANT
BNDG COHESIVE 4X5 TAN STRL (GAUZE/BANDAGES/DRESSINGS) ×3 IMPLANT
BNDG ESMARK 4X9 LF (GAUZE/BANDAGES/DRESSINGS) ×3 IMPLANT
BNDG GAUZE ELAST 4 BULKY (GAUZE/BANDAGES/DRESSINGS) ×3 IMPLANT
BRUSH SCRUB EZ PLAIN DRY (MISCELLANEOUS) IMPLANT
CANISTER SUCT 3000ML PPV (MISCELLANEOUS) ×3 IMPLANT
CHLORAPREP W/TINT 26 (MISCELLANEOUS) ×3 IMPLANT
CORD BIPOLAR FORCEPS 12FT (ELECTRODE) ×3 IMPLANT
COVER BACK TABLE 60X90IN (DRAPES) ×3 IMPLANT
COVER WAND RF STERILE (DRAPES) ×3 IMPLANT
CUFF TOURN SGL QUICK 18X4 (TOURNIQUET CUFF) IMPLANT
CUFF TOURN SGL QUICK 24 (TOURNIQUET CUFF)
CUFF TRNQT CYL 24X4X16.5-23 (TOURNIQUET CUFF) IMPLANT
DRAPE C-ARM 42X72 X-RAY (DRAPES) ×3 IMPLANT
DRAPE SURG 17X23 STRL (DRAPES) ×3 IMPLANT
DRILL BIT 3.5MM (BIT) ×2
DRSG ADAPTIC 3X8 NADH LF (GAUZE/BANDAGES/DRESSINGS) ×3 IMPLANT
DRSG EMULSION OIL 3X3 NADH (GAUZE/BANDAGES/DRESSINGS) IMPLANT
DRSG MEPILEX BORDER 4X4 (GAUZE/BANDAGES/DRESSINGS) ×3 IMPLANT
GAUZE SPONGE 4X4 12PLY STRL (GAUZE/BANDAGES/DRESSINGS) ×3 IMPLANT
GLOVE BIO SURGEON STRL SZ7.5 (GLOVE) ×3 IMPLANT
GLOVE BIOGEL PI IND STRL 8 (GLOVE) ×1 IMPLANT
GLOVE BIOGEL PI INDICATOR 8 (GLOVE) ×2
GOWN STRL REUS W/ TWL XL LVL3 (GOWN DISPOSABLE) ×1 IMPLANT
GOWN STRL REUS W/TWL XL LVL3 (GOWN DISPOSABLE) ×2
KIT BASIN OR (CUSTOM PROCEDURE TRAY) ×3 IMPLANT
NAIL FLEX WIN 3.0MM (Nail) ×3 IMPLANT
NAIL FLEXIBLE WIN 2.5MM (Nail) ×6 IMPLANT
NEEDLE HYPO 25X1 1.5 SAFETY (NEEDLE) IMPLANT
NS IRRIG 1000ML POUR BTL (IV SOLUTION) ×3 IMPLANT
PACK ORTHO EXTREMITY (CUSTOM PROCEDURE TRAY) ×3 IMPLANT
PAD CAST 4YDX4 CTTN HI CHSV (CAST SUPPLIES) ×1 IMPLANT
PADDING CAST ABS 4INX4YD NS (CAST SUPPLIES)
PADDING CAST ABS COTTON 4X4 ST (CAST SUPPLIES) IMPLANT
PADDING CAST COTTON 4X4 STRL (CAST SUPPLIES) ×2
PENCIL BUTTON HOLSTER BLD 10FT (ELECTRODE) IMPLANT
PLATE LOCK COMP 7H FOOT (Plate) ×3 IMPLANT
RUBBERBAND STERILE (MISCELLANEOUS) IMPLANT
SCREW CORTICAL 3.5MM  16MM (Screw) ×4 IMPLANT
SCREW CORTICAL 3.5MM 16MM (Screw) ×2 IMPLANT
SCREW LOCK CORT STAR 3.5X12 (Screw) ×12 IMPLANT
SPLINT FIBERGLASS 3X35 (CAST SUPPLIES) ×3 IMPLANT
SUT VIC AB 2-0 CT3 27 (SUTURE) IMPLANT
SUT VIC AB 3-0 SH 27 (SUTURE) ×4
SUT VIC AB 3-0 SH 27X BRD (SUTURE) ×2 IMPLANT
SUT VICRYL 4-0 PS2 18IN ABS (SUTURE) IMPLANT
SUT VICRYL RAPIDE 4/0 PS 2 (SUTURE) ×6 IMPLANT
SYR 10ML LL (SYRINGE) IMPLANT
TOWEL GREEN STERILE FF (TOWEL DISPOSABLE) ×3 IMPLANT
UNDERPAD 30X30 (UNDERPADS AND DIAPERS) ×3 IMPLANT

## 2019-04-03 NOTE — Progress Notes (Signed)
Hand Surgery  Left forearm still not tense, fingers can be easily passively extended without pain response or spasticity Radial pulse faintly palpable.  Will likely go to OR today on emergency basis to address left forearm and possibly left clavicle injuries.  No one available to discuss careplan or obtain consent.  Have discussed with Dr. Violeta Gelinas trauma surgery who agrees with plan, including removal of C-collar and fixation of clavicle  Neil Crouch, MD [facility] [phone]

## 2019-04-03 NOTE — Progress Notes (Signed)
I have reviewed the patient's follow-up head CT performed today.  His subarachnoid hemorrhage is largely gone.  There is no significant hemorrhage, shift, etc.
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
## 2019-04-03 NOTE — Anesthesia Postprocedure Evaluation (Signed)
Anesthesia Post Note  Patient: John Nielsen.  Procedure(s) Performed: OPEN REDUCTION INTERNAL FIXATION (ORIF) RADIAL/ ULNA FRACTURE (Left )     Patient location during evaluation: SICU Anesthesia Type: General Level of consciousness: sedated Pain management: pain level controlled Vital Signs Assessment: post-procedure vital signs reviewed and stable Respiratory status: patient remains intubated per anesthesia plan Cardiovascular status: stable Postop Assessment: no apparent nausea or vomiting Anesthetic complications: no    Last Vitals:  Vitals:   04/03/19 1500 04/03/19 1721  BP: 127/80 128/78  Pulse: 75 78  Resp: 20 20  Temp:    SpO2: 100% 100%    Last Pain:  Vitals:   04/03/19 1225  TempSrc: Axillary                 Zyriah Mask S
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
## 2019-04-03 NOTE — Transfer of Care (Signed)
Immediate Anesthesia Transfer of Care Note  Patient: John Nielsen.  Procedure(s) Performed: OPEN REDUCTION INTERNAL FIXATION (ORIF) RADIAL/ ULNA FRACTURE (Left )  Patient Location: ICU  Anesthesia Type:General  Level of Consciousness: Patient remains intubated per anesthesia plan  Airway & Oxygen Therapy: Patient remains intubated per anesthesia plan and Patient placed on Ventilator (see vital sign flow sheet for setting)  Post-op Assessment: Report given to RN and Post -op Vital signs reviewed and stable  Post vital signs: Reviewed and stable  Last Vitals:  Vitals Value Taken Time  BP 117/60 (84)   Temp    Pulse 80 04/03/19 1714  Resp 20 04/03/19 1714  SpO2 100 % 04/03/19 1714  Vitals shown include unvalidated device data.  Last Pain:  Vitals:   04/03/19 1225  TempSrc: Axillary         Complications: No apparent anesthesia complications

## 2019-04-03 NOTE — Progress Notes (Signed)
Ventilator pt transported by RT, RN & Transport from [phone] to CT and back without complications.

## 2019-04-04 ENCOUNTER — Inpatient Hospital Stay (HOSPITAL_COMMUNITY): Payer: No Typology Code available for payment source

## 2019-04-04 LAB — BASIC METABOLIC PANEL
Anion gap: 5 (ref 5–15)
BUN: 9 mg/dL (ref 6–20)
CO2: 21 mmol/L — ABNORMAL LOW (ref 22–32)
Calcium: 6.7 mg/dL — ABNORMAL LOW (ref 8.9–10.3)
Chloride: 118 mmol/L — ABNORMAL HIGH (ref 98–111)
Creatinine, Ser: 0.54 mg/dL — ABNORMAL LOW (ref 0.61–1.24)
GFR calc Af Amer: >60 mL/min (ref >60)
GFR calc non Af Amer: >60 mL/min (ref >60)
Glucose, Bld: 77 mg/dL (ref 70–99)
Potassium: 5.2 mmol/L — ABNORMAL HIGH (ref 3.5–5.1)
Sodium: 144 mmol/L (ref 135–145)

## 2019-04-04 LAB — CBC
HCT: 18.9 % — ABNORMAL LOW (ref 39.0–52.0)
HCT: 26.3 % — ABNORMAL LOW (ref 39.0–52.0)
Hemoglobin: 6.8 g/dL — CL (ref 13.0–17.0)
Hemoglobin: 9 g/dL — ABNORMAL LOW (ref 13.0–17.0)
MCH: 29.6 pg (ref 26.0–34.0)
MCH: 28.8 pg (ref 26.0–34.0)
MCHC: 36 g/dL (ref 30.0–36.0)
MCHC: 34.2 g/dL (ref 30.0–36.0)
MCV: 82.2 fL (ref 80.0–100.0)
MCV: 84.3 fL (ref 80.0–100.0)
Platelets: 127 10*3/uL — ABNORMAL LOW (ref 150–400)
Platelets: 150 10*3/uL (ref 150–400)
RBC: 2.3 MIL/uL — ABNORMAL LOW (ref 4.22–5.81)
RBC: 3.12 MIL/uL — ABNORMAL LOW (ref 4.22–5.81)
RDW: 15 % (ref 11.5–15.5)
RDW: 15.7 % — ABNORMAL HIGH (ref 11.5–15.5)
WBC: 8.3 10*3/uL (ref 4.0–10.5)
WBC: 9.6 10*3/uL (ref 4.0–10.5)
nRBC: 0 % (ref 0.0–0.2)
nRBC: 0 % (ref 0.0–0.2)

## 2019-04-04 LAB — MAGNESIUM: Magnesium: 1.4 mg/dL — ABNORMAL LOW (ref 1.7–2.4)

## 2019-04-04 LAB — PREPARE RBC (CROSSMATCH)

## 2019-04-04 LAB — TRIGLYCERIDES: Triglycerides: 334 mg/dL — ABNORMAL HIGH (ref <150)

## 2019-04-04 LAB — PHOSPHORUS: Phosphorus: 2.3 mg/dL — ABNORMAL LOW (ref 2.5–4.6)

## 2019-04-04 LAB — GLUCOSE, CAPILLARY: Glucose-Capillary: 78 mg/dL (ref 70–99)

## 2019-04-04 MED ORDER — SODIUM PHOSPHATES 45 MMOLE/15ML IV SOLN
10.0000 mmol | Freq: Once | INTRAVENOUS | Status: AC
  Administered 2019-04-04: 10 mmol via INTRAVENOUS
  Filled 2019-04-04 (×2): qty 3.33

## 2019-04-04 MED ORDER — VITAL HIGH PROTEIN PO LIQD: 1000.0000 mL | ORAL | Status: DC

## 2019-04-04 MED ORDER — PRO-STAT SUGAR FREE PO LIQD
30.0000 mL | Freq: Two times a day (BID) | ORAL | Status: DC
  Administered 2019-04-04: 10:00:00 30 mL
  Filled 2019-04-04: qty 30

## 2019-04-04 MED ORDER — QUETIAPINE FUMARATE 25 MG PO TABS: 50.0000 mg | ORAL_TABLET | Freq: Two times a day (BID) | ORAL | Status: DC

## 2019-04-04 MED ORDER — MAGNESIUM SULFATE 2 GM/50ML IV SOLN
2.0000 g | Freq: Once | INTRAVENOUS | Status: AC
  Administered 2019-04-04: 2 g via INTRAVENOUS
  Filled 2019-04-04: qty 50

## 2019-04-04 MED ORDER — HALOPERIDOL LACTATE 5 MG/ML IJ SOLN
5.0000 mg | Freq: Four times a day (QID) | INTRAMUSCULAR | Status: DC | PRN
  Administered 2019-04-04 – 2019-04-07 (×3): 5 mg via INTRAVENOUS
  Filled 2019-04-04 (×4): qty 1

## 2019-04-04 MED ORDER — QUETIAPINE FUMARATE 25 MG PO TABS
50.0000 mg | ORAL_TABLET | Freq: Two times a day (BID) | ORAL | Status: DC
  Administered 2019-04-04 (×2): 50 mg
  Filled 2019-04-04 (×3): qty 2

## 2019-04-04 MED ORDER — OXYCODONE HCL 5 MG/5ML PO SOLN
5.0000 mg | ORAL | Status: DC | PRN
  Administered 2019-04-04 – 2019-04-09 (×4): 10 mg
  Filled 2019-04-04 (×4): qty 10

## 2019-04-04 MED ORDER — PIVOT 1.5 CAL PO LIQD
1000.0000 mL | ORAL | Status: DC
  Administered 2019-04-05: 1000 mL
  Filled 2019-04-04: qty 1000

## 2019-04-04 MED ORDER — SODIUM POLYSTYRENE SULFONATE 15 GM/60ML PO SUSP
30.0000 g | Freq: Once | ORAL | Status: AC
  Administered 2019-04-04: 11:00:00 30 g via ORAL
  Filled 2019-04-04: qty 120

## 2019-04-04 MED ORDER — SODIUM CHLORIDE 0.9% IV SOLUTION: Freq: Once | INTRAVENOUS | Status: AC

## 2019-04-04 MED ORDER — CHLORHEXIDINE GLUCONATE 4 % EX LIQD
CUTANEOUS | Status: AC
  Filled 2019-04-04: qty 15
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
## 2019-04-04 NOTE — Progress Notes (Signed)
04-03-19:  ORIF L clavicle fx with IMN and ORIF L BBFFx  Remains intubated/sedated. L hand a little swollen, FA compartments not tense, digits easily passively extended, digits warm & CR brisk L medial clavicle dressing clean/dry  L FA splint to remain on for 3 weeks. L medial clavicle dressing can be removed in a week--sutures are absorbable  Once able, should remain NWB on L UE, but can move it for ADLs as cognition allows Will continue to follow progress while hospitalized.  Neil Crouch, MD Hand Surgery Mobile [phone]
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
## 2019-04-04 NOTE — Progress Notes (Signed)
Right side sheath pulled and exoseal deployed at 12:00 pm.  Manual pressure applied with Quick Clot 4X4.  Complete hemostasis achieved at 1206 and quick clot pad removed.  Groin site dressed with gauze and tegaderms with no complications or evidence of hematoma. Showed site to RN.  Distal pulses palpable.  Emberly Tomasso RTR Glori Luis RTR

## 2019-04-05 ENCOUNTER — Inpatient Hospital Stay (HOSPITAL_COMMUNITY): Payer: No Typology Code available for payment source

## 2019-04-05 LAB — CBC
HCT: 26 % — ABNORMAL LOW (ref 39.0–52.0)
Hemoglobin: 8.8 g/dL — ABNORMAL LOW (ref 13.0–17.0)
MCH: 28.9 pg (ref 26.0–34.0)
MCHC: 33.8 g/dL (ref 30.0–36.0)
MCV: 85.2 fL (ref 80.0–100.0)
Platelets: 160 10*3/uL (ref 150–400)
RBC: 3.05 MIL/uL — ABNORMAL LOW (ref 4.22–5.81)
RDW: 15.5 % (ref 11.5–15.5)
WBC: 7.3 10*3/uL (ref 4.0–10.5)
nRBC: 0 % (ref 0.0–0.2)

## 2019-04-05 LAB — GLUCOSE, CAPILLARY
Glucose-Capillary: 108 mg/dL — ABNORMAL HIGH (ref 70–99)
Glucose-Capillary: 68 mg/dL — ABNORMAL LOW (ref 70–99)
Glucose-Capillary: 81 mg/dL (ref 70–99)
Glucose-Capillary: 81 mg/dL (ref 70–99)
Glucose-Capillary: 86 mg/dL (ref 70–99)
Glucose-Capillary: 85 mg/dL (ref 70–99)
Glucose-Capillary: 66 mg/dL — ABNORMAL LOW (ref 70–99)

## 2019-04-05 LAB — BPAM RBC
Blood Product Expiration Date: 202102102359
Blood Product Expiration Date: 202102122359
Blood Product Expiration Date: 202102162359
ISSUE DATE / TIME: 202101180207
ISSUE DATE / TIME: 202101181039
ISSUE DATE / TIME: 202101190852
Unit Type and Rh: 5100
Unit Type and Rh: 5100
Unit Type and Rh: 5100

## 2019-04-05 LAB — TYPE AND SCREEN
ABO/RH(D): O POS
Antibody Screen: NEGATIVE
Unit division: 0
Unit division: 0
Unit division: 0

## 2019-04-05 LAB — PHOSPHORUS: Phosphorus: 3.3 mg/dL (ref 2.5–4.6)

## 2019-04-05 LAB — BASIC METABOLIC PANEL
Anion gap: 7 (ref 5–15)
BUN: 6 mg/dL (ref 6–20)
CO2: 23 mmol/L (ref 22–32)
Calcium: 7.4 mg/dL — ABNORMAL LOW (ref 8.9–10.3)
Chloride: 107 mmol/L (ref 98–111)
Creatinine, Ser: 0.58 mg/dL — ABNORMAL LOW (ref 0.61–1.24)
GFR calc Af Amer: >60 mL/min (ref >60)
GFR calc non Af Amer: >60 mL/min (ref >60)
Glucose, Bld: 91 mg/dL (ref 70–99)
Potassium: 3 mmol/L — ABNORMAL LOW (ref 3.5–5.1)
Sodium: 137 mmol/L (ref 135–145)

## 2019-04-05 LAB — TRIGLYCERIDES: Triglycerides: 521 mg/dL — ABNORMAL HIGH (ref <150)

## 2019-04-05 LAB — MAGNESIUM: Magnesium: 1.8 mg/dL (ref 1.7–2.4)

## 2019-04-05 MED ORDER — DEXMEDETOMIDINE HCL IN NACL 400 MCG/100ML IV SOLN
0.4000 ug/kg/h | INTRAVENOUS | Status: DC
  Administered 2019-04-05: 09:00:00 0.4 ug/kg/h via INTRAVENOUS
  Administered 2019-04-05: 19:00:00 0.7 ug/kg/h via INTRAVENOUS
  Administered 2019-04-06: 05:00:00 0.6 ug/kg/h via INTRAVENOUS
  Administered 2019-04-06 (×2): 1 ug/kg/h via INTRAVENOUS
  Administered 2019-04-07: 06:00:00 0.8 ug/kg/h via INTRAVENOUS
  Administered 2019-04-07: 21:00:00 0.5 ug/kg/h via INTRAVENOUS
  Filled 2019-04-05 (×7): qty 100

## 2019-04-05 MED ORDER — POTASSIUM CHLORIDE 20 MEQ/15ML (10%) PO SOLN
40.0000 meq | Freq: Once | ORAL | Status: AC
  Administered 2019-04-05: 10:00:00 40 meq
  Filled 2019-04-05: qty 30

## 2019-04-05 MED ORDER — DEXTROSE 50 % IV SOLN
INTRAVENOUS | Status: AC
  Filled 2019-04-05: qty 50

## 2019-04-05 MED ORDER — VECURONIUM BROMIDE 10 MG IV SOLR
10.0000 mg | Freq: Once | INTRAVENOUS | Status: AC
  Administered 2019-04-05: 09:00:00 10 mg via INTRAVENOUS

## 2019-04-05 MED ORDER — DEXTROSE 50 % IV SOLN
12.5000 g | Freq: Once | INTRAVENOUS | Status: AC
  Administered 2019-04-05: 12.5 g via INTRAVENOUS

## 2019-04-05 MED ORDER — VECURONIUM BROMIDE 10 MG IV SOLR
INTRAVENOUS | Status: AC
  Filled 2019-04-05: qty 10

## 2019-04-05 MED ORDER — STERILE WATER FOR INJECTION IJ SOLN
INTRAMUSCULAR | Status: AC
  Filled 2019-04-05: qty 10

## 2019-04-05 MED ORDER — DEXTROSE 50 % IV SOLN
25.0000 mL | Freq: Once | INTRAVENOUS | Status: AC
  Administered 2019-04-05: 04:00:00 25 mL via INTRAVENOUS
  Filled 2019-04-05: qty 50

## 2019-04-05 MED ORDER — QUETIAPINE FUMARATE 50 MG PO TABS
100.0000 mg | ORAL_TABLET | Freq: Two times a day (BID) | ORAL | Status: DC
  Administered 2019-04-05 – 2019-04-09 (×8): 100 mg
  Filled 2019-04-05: qty 2
  Filled 2019-04-05: qty 1
  Filled 2019-04-05: qty 2
  Filled 2019-04-05 (×3): qty 1
  Filled 2019-04-05: qty 2
  Filled 2019-04-05: qty 1
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
## 2019-04-05 NOTE — Progress Notes (Addendum)
     Subjective:  Patient intubated and sedated.   Objective:   VITALS:   Vitals:   04/05/19 0800 04/05/19 0804 04/05/19 0805 04/05/19 0814  BP:  (!) 119/46 (!) 119/46   Pulse:      Resp:      Temp: 97.8 F (36.6 C)     TempSrc: Axillary     SpO2:    100%  Weight:      Height:        General: In no acute distress, lying in bed intubated. Resp: mechanically ventilated Neck: c-collar in place MSK: L forearm splinted. All fingers of left hand move easily to passive ROM. Capillary refill intact. L medial clavicle dressing clean, no drainage noted.   Lab Results  Component Value Date   WBC 7.3 04/05/2019   HGB 8.8 (L) 04/05/2019   HCT 26.0 (L) 04/05/2019   MCV 85.2 04/05/2019   PLT 160 04/05/2019   BMET    Component Value Date/Time   NA 137 04/05/2019 0533   K 3.0 (L) 04/05/2019 0533   CL 107 04/05/2019 0533   CO2 23 04/05/2019 0533   GLUCOSE 91 04/05/2019 0533   BUN 6 04/05/2019 0533   CREATININE 0.58 (L) 04/05/2019 0533   CALCIUM 7.4 (L) 04/05/2019 0533   GFRNONAA >60 04/05/2019 0533   GFRAA >60 04/05/2019 0533     Assessment/Plan: 2 Days Post-Op   Active Problems:   ICH (intracerebral hemorrhage) (HCC)  Left clavicle fracture and both bone forearm fracture - 04/03/19 - ORIF L clavicle fx with IMN and ORIF L both bone forearm Fx with Dr. Janee Morn. Per Dr. Janee Morn, NWB with LUE, but can move it for ADLs as cognition allows.  Left anterior superior iliac spine fracture - Fx appears stable and does not indicate surgical intervention. WBAT LLE, follow-up with Dr. Dion Saucier 1-2 weeks after discharge. Will follow along intermittently to reassess his recovery as he progresses.    John Nielsen 04/05/2019, 8:54 AM   Teryl Lucy, MD Cell [phone]
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
## 2019-04-05 NOTE — Progress Notes (Signed)
Weaned prop to 47mcg/kg/min. Prop stopped for 30 min, patient woke up thrashing in the bed and attempting to self extubate. Prop resumed.   HR with precidex at 0.7 is in 50s.

## 2019-04-05 NOTE — Progress Notes (Signed)
Subjective: By report the patient at times is agitated.  Presently he is intubated, sedated and in no apparent distress.  Objective: Vital signs in last 24 hours: Temp:  [97.1 F (36.2 C)-97.8 F (36.6 C)] 97.8 F (36.6 C) (01/20 0800) Pulse Rate:  [61-90] 63 (01/20 0700) Resp:  [20-21] 20 (01/20 0700) BP: (85-127)/(46-74) 119/46 (01/20 0805) SpO2:  [94 %-100 %] 100 % (01/20 0814) Arterial Line BP: (103)/(54) 103/54 (01/19 1100) FiO2 (%):  [40 %] 40 % (01/20 0814) Weight:  [60.1 kg] 60.1 kg (01/20 0500) Estimated body mass index is 21.39 kg/m as calculated from the following:   Height as of this encounter: 5\' 6"  (1.676 m).   Weight as of this encounter: 60.1 kg.   Intake/Output from previous day: 01/19 0701 - 01/20 0700 In: 1764.5 [I.V.:883.5; Blood:380; IV Piggyback:500.9] Out: 1525 [Urine:1425; Chest Tube:100] Intake/Output this shift: Total I/O In: 27.2 [I.V.:27.2] Out: -   Physical exam by report the patient moves all 4 extremities to painful stimuli.  His pupils are equal.  Lab Results: Recent Labs    04/04/19 1400 04/05/19 0533  WBC 9.6 7.3  HGB 9.0* 8.8*  HCT 26.3* 26.0*  PLT 150 160   BMET Recent Labs    04/04/19 0651 04/05/19 0533  NA 144 137  K 5.2* 3.0*  CL 118* 107  CO2 21* 23  GLUCOSE 77 91  BUN 9 6  CREATININE 0.54* 0.58*  CALCIUM 6.7* 7.4*    Studies/Results: DG Clavicle Left  Result Date: 04/03/2019 CLINICAL DATA:  Clavicle fracture EXAM: LEFT CLAVICLE - 2+ VIEWS; DG C-ARM 1-60 MIN COMPARISON:  03/31/2018 FINDINGS: Six low resolution intraoperative spot views of the left clavicle were obtained. Total fluoroscopy time was 1 minutes 24 seconds. Internal fixation of fracture involving the proximal to midshaft of left clavicle with near anatomic alignment IMPRESSION: Intraoperative fluoroscopic assistance provided during surgical fixation of left clavicle fracture. Intraoperative fluoroscopic assistance provided during surgical fixation of left  clavicle fracture. Electronically Signed   By: 04/02/2018 M.D.   On: 04/03/2019 17:22   DG Forearm Left  Result Date: 04/03/2019 CLINICAL DATA:  Forearm fracture EXAM: LEFT FOREARM - 2 VIEW COMPARISON:  04/02/2019 radiograph FINDINGS: Four low resolution intraoperative spot views of the left forearm. Images demonstrate surgical plate and multiple screw fixation of mildly comminuted midshaft ulna fracture with anatomic alignment. Wire fixation of proximal radius shaft fracture with residual 1/4 bone with ulnar displacement of distal fracture fragment. Total fluoroscopy time was 1 minutes 24 seconds. IMPRESSION: Intraoperative fluoroscopic assistance provided during surgical fixation of forearm fractures. Intraoperative fluoroscopic assistance provided during surgical fixation of left forearm fractures. Electronically Signed   By: 04/04/2019 M.D.   On: 04/03/2019 17:20   CT HEAD WO CONTRAST  Result Date: 04/03/2019 CLINICAL DATA:  Head trauma, moderate/severe. Follow-up multi trauma EXAM: CT HEAD WITHOUT CONTRAST TECHNIQUE: Contiguous axial images were obtained from the base of the skull through the vertex without intravenous contrast. COMPARISON:  CT head/maxillofacial 04/02/2019 FINDINGS: Brain: Again demonstrated is a small depressed bone fragment deep to a minimally displaced left frontal calvarial fracture. Interval decrease in conspicuity of acute subarachnoid hemorrhage along the anterior left frontal lobe (most notably on series 3, image 19). Small volume acute subarachnoid hemorrhage is now present more posteriorly along the bilateral frontoparietal lobes, likely secondary to interval redistribution. Trace acute subdural hemorrhage along the posterior falx, in retrospect present on the prior exam (series 3, image 21). No large delayed intracranial hemorrhage. No  demarcated cortical infarction. No midline shift. No hydrocephalus. Vascular: No hyperdense vessel. Skull: Redemonstrated minimally  displaced left frontal calvarial fracture extending into the left sphenoid wing and superolateral left orbit. Acute left orbital fractures were better assessed on prior maxillofacial CT. Redemonstrated nondisplaced left zygomatic arch fracture. Sinuses/Orbits: Visualized orbits demonstrate no acute abnormality. Partial opacification of the paranasal sinuses, most notably of the bilateral ethmoid and sphenoid sinuses. Air-fluid levels within the left frontal and bilateral maxillary sinuses. No significant mastoid effusion. Other: Redemonstrated large left sided scalp hematoma. Redemonstrated smaller right temporoparietal scalp hematoma. IMPRESSION: Interval redistribution of small volume subarachnoid hemorrhage, now most notable along the anterior left frontal lobe and more posteriorly along the bilateral frontoparietal lobes. Redemonstrated trace acute subdural hemorrhage along the posterior falx. No large delayed intracranial hemorrhage. No demarcated cortical infarct, midline shift or hydrocephalus. No other significant interval change. Electronically Signed   By: Kellie Simmering DO   On: 04/03/2019 10:08   DG CHEST PORT 1 VIEW  Result Date: 04/04/2019 CLINICAL DATA:  Trauma.  Left pneumothorax. EXAM: PORTABLE CHEST 1 VIEW COMPARISON:  04/03/2019.  CT report 04/02/2019. FINDINGS: Endotracheal tube, NG tube, right PICC line in stable position, left chest tube in stable position. No pneumothorax noted on today's exam. Stable left upper lobe infiltrate/contusion again noted. Tiny right pleural effusion cannot be excluded. Prior ORIF left clavicle with good anatomic. Embolization device noted and the left upper quadrant. IMPRESSION: 1. Lines and tubes including left chest tube in stable position. No pneumothorax noted on today's exam. 2. Stable left upper lobe infiltrate/contusion again noted. Tiny right pleural effusion cannot be excluded. 3.  Prior ORIF left clavicle with good anatomic alignment. Electronically  Signed   By: Marcello Moores  Register   On: 04/04/2019 07:23   DG C-Arm 1-60 Min  Result Date: 04/03/2019 CLINICAL DATA:  Clavicle fracture EXAM: LEFT CLAVICLE - 2+ VIEWS; DG C-ARM 1-60 MIN COMPARISON:  03/31/2018 FINDINGS: Six low resolution intraoperative spot views of the left clavicle were obtained. Total fluoroscopy time was 1 minutes 24 seconds. Internal fixation of fracture involving the proximal to midshaft of left clavicle with near anatomic alignment IMPRESSION: Intraoperative fluoroscopic assistance provided during surgical fixation of left clavicle fracture. Intraoperative fluoroscopic assistance provided during surgical fixation of left clavicle fracture. Electronically Signed   By: Donavan Foil M.D.   On: 04/03/2019 17:22    Assessment/Plan: Traumatic brain injury, traumatic subarachnoid hemorrhage, skull fracture: The patient's follow-up scan was stable.  Continue supportive care.  LOS: 3 days     Ophelia Charter 04/05/2019, 9:34 AM
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
## 2019-04-06 ENCOUNTER — Inpatient Hospital Stay (HOSPITAL_COMMUNITY): Payer: No Typology Code available for payment source

## 2019-04-06 ENCOUNTER — Encounter: Payer: Self-pay | Admitting: *Deleted

## 2019-04-06 LAB — BASIC METABOLIC PANEL
Anion gap: 5 (ref 5–15)
BUN: 9 mg/dL (ref 6–20)
CO2: 25 mmol/L (ref 22–32)
Calcium: 7.9 mg/dL — ABNORMAL LOW (ref 8.9–10.3)
Chloride: 111 mmol/L (ref 98–111)
Creatinine, Ser: 0.51 mg/dL — ABNORMAL LOW (ref 0.61–1.24)
GFR calc Af Amer: >60 mL/min (ref >60)
GFR calc non Af Amer: >60 mL/min (ref >60)
Glucose, Bld: 142 mg/dL — ABNORMAL HIGH (ref 70–99)
Potassium: 3.4 mmol/L — ABNORMAL LOW (ref 3.5–5.1)
Sodium: 141 mmol/L (ref 135–145)

## 2019-04-06 LAB — CBC
HCT: 28.1 % — ABNORMAL LOW (ref 39.0–52.0)
Hemoglobin: 9.7 g/dL — ABNORMAL LOW (ref 13.0–17.0)
MCH: 29 pg (ref 26.0–34.0)
MCHC: 34.5 g/dL (ref 30.0–36.0)
MCV: 83.9 fL (ref 80.0–100.0)
Platelets: 213 10*3/uL (ref 150–400)
RBC: 3.35 MIL/uL — ABNORMAL LOW (ref 4.22–5.81)
RDW: 15.6 % — ABNORMAL HIGH (ref 11.5–15.5)
WBC: 7 10*3/uL (ref 4.0–10.5)
nRBC: 0 % (ref 0.0–0.2)

## 2019-04-06 LAB — GLUCOSE, CAPILLARY
Glucose-Capillary: 107 mg/dL — ABNORMAL HIGH (ref 70–99)
Glucose-Capillary: 122 mg/dL — ABNORMAL HIGH (ref 70–99)
Glucose-Capillary: 124 mg/dL — ABNORMAL HIGH (ref 70–99)
Glucose-Capillary: 129 mg/dL — ABNORMAL HIGH (ref 70–99)
Glucose-Capillary: 85 mg/dL (ref 70–99)
Glucose-Capillary: 91 mg/dL (ref 70–99)
Glucose-Capillary: 96 mg/dL (ref 70–99)

## 2019-04-06 LAB — TRIGLYCERIDES: Triglycerides: 212 mg/dL — ABNORMAL HIGH (ref <150)

## 2019-04-06 MED ORDER — ENOXAPARIN SODIUM 40 MG/0.4ML ~~LOC~~ SOLN
40.0000 mg | SUBCUTANEOUS | Status: DC
  Administered 2019-04-06 – 2019-05-03 (×27): 40 mg via SUBCUTANEOUS
  Filled 2019-04-06 (×28): qty 0.4

## 2019-04-06 MED ORDER — CLONAZEPAM 0.5 MG PO TABS
0.5000 mg | ORAL_TABLET | Freq: Two times a day (BID) | ORAL | Status: DC
  Administered 2019-04-06 – 2019-04-09 (×6): 0.5 mg
  Filled 2019-04-06 (×6): qty 1

## 2019-04-06 MED ORDER — POTASSIUM CHLORIDE 20 MEQ/15ML (10%) PO SOLN
40.0000 meq | Freq: Once | ORAL | Status: AC
  Administered 2019-04-06: 09:00:00 40 meq
  Filled 2019-04-06: qty 30

## 2019-04-06 MED ORDER — BETHANECHOL CHLORIDE 25 MG PO TABS
25.0000 mg | ORAL_TABLET | Freq: Three times a day (TID) | ORAL | Status: DC
  Administered 2019-04-06 – 2019-04-09 (×9): 25 mg
  Filled 2019-04-06: qty 1
  Filled 2019-04-06: qty 3
  Filled 2019-04-06 (×2): qty 1
  Filled 2019-04-06: qty 3
  Filled 2019-04-06: qty 1
  Filled 2019-04-06 (×2): qty 3
  Filled 2019-04-06: qty 1

## 2019-04-06 NOTE — Progress Notes (Signed)
RT heard pt ventilator alarming and went into room and saw that pt had self extubated himself. Pt was aware, saturations of 100% on room air, able to say his name, followed commands, and is suctioning himself out. RT heard no stridor. RN made MD aware, MD coming to assess pt. RT will continue to monitor.

## 2019-04-06 NOTE — Progress Notes (Signed)
Patient ID: John Nielsen., male   DOB: [birth year], 34 y.o.   MRN: [id number] Patient self-extubated but is doing well. Sats 100% on RA. RR 21. Answering some questions. I cleared his neck. Likely will still need Precedex.  Violeta Gelinas, MD, MPH, FACS Please use [email or web address] to contact on call provider
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
## 2019-04-06 NOTE — Progress Notes (Signed)
This RN bladder scanned patient, which showed 449 mL in the bladder. Trauma MD Tsuei updated about bladder scan, and that patient has had 3 intermittent catheters over 24 hours. Verbal order to place foley catheter for retention. Will continue to monitor.
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
## 2019-04-06 NOTE — Progress Notes (Signed)
100 mL of fentanyl wasted with Quitman Livings RN

## 2019-04-07 ENCOUNTER — Inpatient Hospital Stay (HOSPITAL_COMMUNITY): Payer: No Typology Code available for payment source

## 2019-04-07 LAB — CBC
HCT: 29.5 % — ABNORMAL LOW (ref 39.0–52.0)
Hemoglobin: 10.4 g/dL — ABNORMAL LOW (ref 13.0–17.0)
MCH: 28.8 pg (ref 26.0–34.0)
MCHC: 35.3 g/dL (ref 30.0–36.0)
MCV: 81.7 fL (ref 80.0–100.0)
Platelets: 247 10*3/uL (ref 150–400)
RBC: 3.61 MIL/uL — ABNORMAL LOW (ref 4.22–5.81)
RDW: 14.7 % (ref 11.5–15.5)
WBC: 8.9 10*3/uL (ref 4.0–10.5)
nRBC: 0 % (ref 0.0–0.2)

## 2019-04-07 LAB — TRIGLYCERIDES: Triglycerides: 109 mg/dL (ref <150)

## 2019-04-07 LAB — GLUCOSE, CAPILLARY
Glucose-Capillary: 102 mg/dL — ABNORMAL HIGH (ref 70–99)
Glucose-Capillary: 106 mg/dL — ABNORMAL HIGH (ref 70–99)
Glucose-Capillary: 101 mg/dL — ABNORMAL HIGH (ref 70–99)
Glucose-Capillary: 98 mg/dL (ref 70–99)
Glucose-Capillary: 100 mg/dL — ABNORMAL HIGH (ref 70–99)
Glucose-Capillary: 132 mg/dL — ABNORMAL HIGH (ref 70–99)

## 2019-04-07 LAB — BASIC METABOLIC PANEL
Anion gap: 9 (ref 5–15)
BUN: 10 mg/dL (ref 6–20)
CO2: 23 mmol/L (ref 22–32)
Calcium: 8 mg/dL — ABNORMAL LOW (ref 8.9–10.3)
Chloride: 106 mmol/L (ref 98–111)
Creatinine, Ser: 0.64 mg/dL (ref 0.61–1.24)
GFR calc Af Amer: >60 mL/min (ref >60)
GFR calc non Af Amer: >60 mL/min (ref >60)
Glucose, Bld: 118 mg/dL — ABNORMAL HIGH (ref 70–99)
Potassium: 3.4 mmol/L — ABNORMAL LOW (ref 3.5–5.1)
Sodium: 138 mmol/L (ref 135–145)

## 2019-04-07 MED ORDER — CHLORHEXIDINE GLUCONATE 0.12 % MT SOLN
15.0000 mL | Freq: Two times a day (BID) | OROMUCOSAL | Status: DC
  Administered 2019-04-08 – 2019-04-16 (×17): 15 mL via OROMUCOSAL
  Filled 2019-04-07 (×18): qty 15

## 2019-04-07 MED ORDER — PIVOT 1.5 CAL PO LIQD
1000.0000 mL | ORAL | Status: DC
  Administered 2019-04-07 – 2019-04-09 (×3): 1000 mL
  Filled 2019-04-07 (×4): qty 1000

## 2019-04-07 MED ORDER — POTASSIUM CHLORIDE 20 MEQ/15ML (10%) PO SOLN
40.0000 meq | Freq: Once | ORAL | Status: AC
  Administered 2019-04-07: 19:00:00 40 meq via ORAL
  Filled 2019-04-07: qty 30

## 2019-04-07 MED ORDER — ORAL CARE MOUTH RINSE
15.0000 mL | Freq: Two times a day (BID) | OROMUCOSAL | Status: DC
  Administered 2019-04-07 – 2019-04-28 (×32): 15 mL via OROMUCOSAL

## 2019-04-07 MED ORDER — CHLORHEXIDINE GLUCONATE CLOTH 2 % EX PADS
6.0000 | MEDICATED_PAD | Freq: Every day | CUTANEOUS | Status: DC
  Administered 2019-04-11 – 2019-04-28 (×10): 6 via TOPICAL
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
## 2019-04-07 NOTE — Care Management (Signed)
Pt currently remains inappropriate for SBIRT assessment due to cognitive deficits from TBI.    Quintella Baton, RN, BSN  Trauma/Neuro ICU Case Manager [phone]
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
## 2019-04-07 NOTE — Procedures (Signed)
Cortrak  Person Inserting Tube:  Chonda Baney M, RD Tube Type:  Cortrak - 43 inches Tube Location:  Right nare Initial Placement:  Stomach Secured by: Bridle Technique Used to Measure Tube Placement:  Documented cm marking at nare/ corner of mouth Cortrak Secured At:  74 cm Procedure Comments:  Cortrak Tube Team Note:  Consult received to place a Cortrak feeding tube.   No x-ray is required. RN may begin using tube.   If the tube becomes dislodged please keep the tube and contact the Cortrak team at [email or web address] (password [email or web address]) for replacement.  If after hours and replacement cannot be delayed, place a NG tube and confirm placement with an abdominal x-ray.      Naomy Esham, MS, RD, LDN, CNSC Inpatient Clinical Dietitian Pager # [phone] After hours/weekend pager # [phone]      
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
## 2019-04-07 NOTE — Progress Notes (Signed)
Rehab Admissions Coordinator Note:  Per OT and SLP recommendation, this patient was screened by Cheri Rous for appropriateness for an Inpatient Acute Rehab Consult.  At this time, we are recommending an Inpatient Rehab consult.  AC will contact attending service to request order.   Cheri Rous 04/07/2019, 2:28 PM  I can be reached at [phone].
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
## 2019-04-07 NOTE — Progress Notes (Signed)
  Plastic Surgery  HD # 6  Events noted, patient self extubated yesterday pm.  PE Sleeping, will answer to name, attempted to complete facial nerve exam and sensory exam. Patient intermittently able to do this before returning to sleep.  Left brow and forehead repair intact, dried blood in scalp Grossly has movement left brow medial to laceration, and has movement orbicularis at lateral canthus. No gross asymmetry brow position   A/P  Sutures can be removed early next week. Ok for all face and head to be bathed normally once able. Will try again next week to complete exam.  Glenna Fellows, MD [facility]

## 2019-04-08 LAB — GLUCOSE, CAPILLARY
Glucose-Capillary: 108 mg/dL — ABNORMAL HIGH (ref 70–99)
Glucose-Capillary: 113 mg/dL — ABNORMAL HIGH (ref 70–99)
Glucose-Capillary: 115 mg/dL — ABNORMAL HIGH (ref 70–99)
Glucose-Capillary: 116 mg/dL — ABNORMAL HIGH (ref 70–99)
Glucose-Capillary: 132 mg/dL — ABNORMAL HIGH (ref 70–99)

## 2019-04-08 LAB — TRIGLYCERIDES: Triglycerides: 122 mg/dL (ref <150)

## 2019-04-08 LAB — BASIC METABOLIC PANEL
Anion gap: 9 (ref 5–15)
BUN: 13 mg/dL (ref 6–20)
CO2: 21 mmol/L — ABNORMAL LOW (ref 22–32)
Calcium: 8.2 mg/dL — ABNORMAL LOW (ref 8.9–10.3)
Chloride: 108 mmol/L (ref 98–111)
Creatinine, Ser: 0.72 mg/dL (ref 0.61–1.24)
GFR calc Af Amer: >60 mL/min (ref >60)
GFR calc non Af Amer: >60 mL/min (ref >60)
Glucose, Bld: 125 mg/dL — ABNORMAL HIGH (ref 70–99)
Potassium: 3.7 mmol/L (ref 3.5–5.1)
Sodium: 138 mmol/L (ref 135–145)

## 2019-04-08 LAB — CBC
HCT: 31.3 % — ABNORMAL LOW (ref 39.0–52.0)
Hemoglobin: 11 g/dL — ABNORMAL LOW (ref 13.0–17.0)
MCH: 28.4 pg (ref 26.0–34.0)
MCHC: 35.1 g/dL (ref 30.0–36.0)
MCV: 80.9 fL (ref 80.0–100.0)
Platelets: 284 10*3/uL (ref 150–400)
RBC: 3.87 MIL/uL — ABNORMAL LOW (ref 4.22–5.81)
RDW: 15.1 % (ref 11.5–15.5)
WBC: 11.8 10*3/uL — ABNORMAL HIGH (ref 4.0–10.5)
nRBC: 0 % (ref 0.0–0.2)
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
## 2019-04-08 NOTE — Progress Notes (Signed)
Assisted tele visit to patient with mother.  Annelle Behrendt P, RN  
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
## 2019-04-08 NOTE — Progress Notes (Addendum)
Pt transfererd from 4N Vital signs as followed: B/P 142/89, HR75, RR18 O2 Sat10% at room air.  Pt is alert and oriented to person, place and situation.  Suicide sitter present, no restraints put in place. Will continue to monitor.
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
## 2019-04-09 ENCOUNTER — Inpatient Hospital Stay (HOSPITAL_COMMUNITY): Payer: No Typology Code available for payment source

## 2019-04-09 LAB — GLUCOSE, CAPILLARY
Glucose-Capillary: 117 mg/dL — ABNORMAL HIGH (ref 70–99)
Glucose-Capillary: 121 mg/dL — ABNORMAL HIGH (ref 70–99)
Glucose-Capillary: 136 mg/dL — ABNORMAL HIGH (ref 70–99)
Glucose-Capillary: 119 mg/dL — ABNORMAL HIGH (ref 70–99)

## 2019-04-09 MED ORDER — LORAZEPAM 2 MG/ML IJ SOLN
0.5000 mg | Freq: Four times a day (QID) | INTRAMUSCULAR | Status: DC | PRN
  Administered 2019-04-09: 11:00:00 0.5 mg via INTRAVENOUS
  Filled 2019-04-09: qty 1

## 2019-04-09 MED ORDER — RESOURCE THICKENUP CLEAR PO POWD
ORAL | Status: DC | PRN
  Filled 2019-04-09: qty 125

## 2019-04-09 NOTE — Progress Notes (Signed)
Patient is extremely unstable on his feet.  He became agitated and got up out of bed spontaneously setting off the bed alarm.  Sitter in room got up immediately to help but needed extra staff so sitter called out for help. Nurse and NT entered room to assist.  Three staff members had to stand by until we were able to get patient safely back in bed.  Patient is wanting applesauce and apple juice.  Staff provided since patient is on dysphagia I diet and we thickened his juice.  Patient is currently in bed safely.

## 2019-04-09 NOTE — Progress Notes (Signed)
Modified Barium Swallow Progress Note  Patient Details  Name: John Nielsen. MRN: [id number] Date of Birth: 1985-05-13  Today's Date: 04/09/2019  Modified Barium Swallow completed.  Full report located under Chart Review in the Imaging Section.  Brief recommendations include the following:  Clinical Impression  MBS was comleted using thin liquids via spoon, nectar thick liquids via spoon andn straw, honey thick liquids via spoon and straw, pureed material and dual textured solids.  The patient was very anxoius to be able to eat/drink and was noted to be impulsive with intake ingesting large amounts at a time despite cues to take small sips.  He presented with an oral and pharyngeal dysphagia.  The oral phase was marked by decreaseg lingual control which led to premature loss of the bolus.  This allowed material to fall to the level of the pyriform sinuses prior to the swallow trigger.  Oral residue was also seen across bolus types as well as a delay in a/p transport.  The pharyngeal phase was marked by decreased laryngeal elevation which led to decreased laryngeal vestibule closure.  In addition, decreased pharyngeal stripping and base of tongue retraction was seen which led to mild to moderate vallecula residue across textures.  It was mild for all textures except honey thick liquids.  Given honey thick liquids moderate pyriforn sinus residue was also seen.  Penetration into the laryngeal vestibule was seen during the swallow given thin liquids via spoon, thin liquids via straw while using a chin tuck, and honey thick liquids via spoon sips.  Transient penetration was seen during the swallow given honey thick liquids via straw sips and nectar thick liquids via straw sips while using a chin tuck.  No aspiration was seen and no penetration was seen given pureed material and dual textured solids.  Esophageal sweep did not reveal overt issues.  Due to the patient's impulsivity recommend to being a  pureed diet with pudding thick liquids.  Suspect he will improve rapidly, particularly as his mentation improves.  He will need full supervision at this time.  ST will follow up for therapeutic diet tolerance, swallowing therapy and possible diet advancement.     Swallow Evaluation Recommendations       SLP Diet Recommendations: Dysphagia 1 (Puree) solids;Pudding thick liquid   Liquid Administration via: Spoon   Medication Administration: Crushed with puree   Supervision: Staff to assist with self feeding;Full supervision/cueing for compensatory strategies   Compensations: Slow rate;Small sips/bites   Postural Changes: Seated upright at 90 degrees   Oral Care Recommendations: Oral care BID   Other Recommendations: Order thickener from pharmacy;Have oral suction available   Dimas Aguas, MA, CCC-SLP Acute Rehab SLP [phone]  Fleet Contras 04/09/2019,2:12 PM
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
## 2019-04-10 LAB — GLUCOSE, CAPILLARY
Glucose-Capillary: 113 mg/dL — ABNORMAL HIGH (ref 70–99)
Glucose-Capillary: 119 mg/dL — ABNORMAL HIGH (ref 70–99)
Glucose-Capillary: 125 mg/dL — ABNORMAL HIGH (ref 70–99)
Glucose-Capillary: 101 mg/dL — ABNORMAL HIGH (ref 70–99)
Glucose-Capillary: 116 mg/dL — ABNORMAL HIGH (ref 70–99)
Glucose-Capillary: 120 mg/dL — ABNORMAL HIGH (ref 70–99)

## 2019-04-10 MED ORDER — POLYETHYLENE GLYCOL 3350 17 G PO PACK
17.0000 g | PACK | Freq: Every day | ORAL | Status: DC
  Administered 2019-04-10 – 2019-04-28 (×9): 17 g via ORAL
  Filled 2019-04-10 (×20): qty 1

## 2019-04-10 MED ORDER — OXYCODONE HCL 5 MG PO TABS
5.0000 mg | ORAL_TABLET | ORAL | Status: DC | PRN
  Administered 2019-04-10 – 2019-04-11 (×2): 10 mg via ORAL
  Administered 2019-04-20: 5 mg via ORAL
  Filled 2019-04-10 (×2): qty 2
  Filled 2019-04-10: qty 1
  Filled 2019-04-10 (×2): qty 2

## 2019-04-10 MED ORDER — QUETIAPINE FUMARATE 50 MG PO TABS
100.0000 mg | ORAL_TABLET | Freq: Two times a day (BID) | ORAL | Status: DC
  Administered 2019-04-10 – 2019-04-14 (×10): 100 mg via ORAL
  Filled 2019-04-10 (×11): qty 2

## 2019-04-10 MED ORDER — BETHANECHOL CHLORIDE 25 MG PO TABS
25.0000 mg | ORAL_TABLET | Freq: Three times a day (TID) | ORAL | Status: DC
  Administered 2019-04-10 – 2019-04-21 (×34): 25 mg via ORAL
  Filled 2019-04-10 (×33): qty 1

## 2019-04-10 MED ORDER — DOCUSATE SODIUM 100 MG PO CAPS
100.0000 mg | ORAL_CAPSULE | Freq: Every day | ORAL | Status: DC
  Administered 2019-04-10 – 2019-04-28 (×14): 100 mg via ORAL
  Filled 2019-04-10 (×21): qty 1

## 2019-04-10 MED ORDER — CLONAZEPAM 0.5 MG PO TABS
0.5000 mg | ORAL_TABLET | Freq: Two times a day (BID) | ORAL | Status: DC
  Administered 2019-04-10 – 2019-04-16 (×13): 0.5 mg via ORAL
  Filled 2019-04-10 (×13): qty 1

## 2019-04-10 NOTE — Progress Notes (Signed)
Inpatient Rehab Admissions Coordinator:   Note Psychiatric service recommending inpatient psych placement once pt medically stable, which, unfortunately, precludes him from being a CIR candidate.  Will sign off at this time.   Estill Dooms, PT, DPT Admissions Coordinator [phone] 04/10/19  9:43 AM
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
## 2019-04-10 NOTE — Progress Notes (Signed)
Sutures removed from left head. Patient tolerated very well.

## 2019-04-11 LAB — CBC
HCT: 31.1 % — ABNORMAL LOW (ref 39.0–52.0)
Hemoglobin: 10.6 g/dL — ABNORMAL LOW (ref 13.0–17.0)
MCH: 28.6 pg (ref 26.0–34.0)
MCHC: 34.1 g/dL (ref 30.0–36.0)
MCV: 83.8 fL (ref 80.0–100.0)
Platelets: 558 10*3/uL — ABNORMAL HIGH (ref 150–400)
RBC: 3.71 MIL/uL — ABNORMAL LOW (ref 4.22–5.81)
RDW: 16 % — ABNORMAL HIGH (ref 11.5–15.5)
WBC: 13 10*3/uL — ABNORMAL HIGH (ref 4.0–10.5)
nRBC: 0 % (ref 0.0–0.2)

## 2019-04-11 MED ORDER — NICOTINE 14 MG/24HR TD PT24
14.0000 mg | MEDICATED_PATCH | Freq: Every day | TRANSDERMAL | Status: DC
  Administered 2019-04-11 – 2019-05-03 (×23): 14 mg via TRANSDERMAL
  Filled 2019-04-11 (×23): qty 1

## 2019-04-11 MED ORDER — ADULT MULTIVITAMIN W/MINERALS CH
1.0000 | ORAL_TABLET | Freq: Every day | ORAL | Status: DC
  Administered 2019-04-11 – 2019-05-03 (×23): 1 via ORAL
  Filled 2019-04-11 (×23): qty 1
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
## 2019-04-11 NOTE — Progress Notes (Signed)
Left head incision sutures removed by RN 1/25, incision clean, dry and intact. 1/26 1821 called to patient's room, incision slightly open at the top (in the hair). No drainage. Dr. Harlon Flor notified, states to clean with gauze as needed and continue to monitor, will likely heal on its own.
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
## 2019-04-11 NOTE — Progress Notes (Signed)
OT Cancellation Note  Patient Details Name: John Nielsen. MRN: [id number] DOB: 04-Jun-1985   Cancelled Treatment:    Reason Eval/Treat Not Completed: Other (comment) SLP in with pt, OT will try again next available time  Galen Manila 04/11/2019, 2:26 PM
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
## 2019-04-11 NOTE — Plan of Care (Signed)
Problem: Activity: Goal: Ability to perform activities at highest level will improve Outcome: Progressing Goal: Ability to avoid complications of mobility impairment will improve Outcome: Progressing Goal: Ability to tolerate increased activity will improve Outcome: Progressing Goal: Will remain free from falls Outcome: Progressing   Problem: Respiratory: Goal: Ability to maintain adequate ventilation will improve Outcome: Progressing   Problem: Tissue Perfusion: Goal: Hemodynamically stable with effective tissue perfusion will improve Outcome: Progressing Goal: Postoperative complications will be avoided or minimized Outcome: Progressing   Problem: Skin Integrity: Goal: Ability to maintain adequate tissue integrity will improve Outcome: Progressing   Problem: Infection: Goal: Risk for infection will decrease (spleen) Outcome: Progressing   Problem: Bowel/Gastric: Goal: Gastrointestinal status for postoperative course will improve Outcome: Progressing Goal: GI tract motility and GI tissue perfusion will improve Outcome: Progressing Goal: Ability to demonstrate the techniques of an individualized bowel program will improve Outcome: Progressing   Problem: Urinary Elimination: Goal: Ability to achieve a regular elimination pattern will improve Outcome: Progressing   Problem: Fluid Volume: Goal: Return to normovolemic state will improve Outcome: Progressing   Problem: Metabolic: Goal: Ability to maintain a metabolic balance will improve Outcome: Progressing   Problem: Education: Goal: Knowledge of the prescribed therapeutic regimen will improve Outcome: Progressing Goal: Knowledge of injury and care (of blunt abdominal trauma) will improve Outcome: Progressing   Problem: Coping: Goal: Exhibits appropriate coping mechanism and reduced anxiety resulting from physical and emotional stress Outcome: Progressing   Problem: Self-Concept: Goal: Ability to acknowledge  body changes will improve Outcome: Progressing   Problem: Education: Goal: Knowledge of the prescribed therapeutic regimen Outcome: Progressing Goal: Knowledge of disease or condition will improve Outcome: Progressing   Problem: Clinical Measurements: Goal: Neurologic status will improve Outcome: Progressing   Problem: Tissue Perfusion: Goal: Ability to maintain intracranial pressure will improve Outcome: Progressing   Problem: Respiratory: Goal: Will regain and/or maintain adequate ventilation Outcome: Progressing   Problem: Skin Integrity: Goal: Risk for impaired skin integrity will decrease Outcome: Progressing Goal: Demonstration of wound healing without infection will improve Outcome: Progressing   Problem: Psychosocial: Goal: Ability to verbalize positive feelings about self will improve Outcome: Progressing Goal: Ability to participate in self-care as condition permits will improve Outcome: Progressing Goal: Ability to identify appropriate support needs will improve Outcome: Progressing   Problem: Health Behavior/Discharge Planning: Goal: Ability to manage health-related needs will improve Outcome: Progressing   Problem: Nutritional: Goal: Risk of aspiration will decrease Outcome: Progressing Goal: Dietary intake will improve Outcome: Progressing   Problem: Communication: Goal: Ability to communicate needs accurately will improve Outcome: Progressing   Problem: Education: Goal: Knowledge of General Education information will improve Description: Including pain rating scale, medication(s)/side effects and non-pharmacologic comfort measures Outcome: Progressing   Problem: Health Behavior/Discharge Planning: Goal: Ability to manage health-related needs will improve Outcome: Progressing   Problem: Clinical Measurements: Goal: Ability to maintain clinical measurements within normal limits will improve Outcome: Progressing Goal: Will remain free from  infection Outcome: Progressing Goal: Diagnostic test results will improve Outcome: Progressing Goal: Respiratory complications will improve Outcome: Progressing Goal: Cardiovascular complication will be avoided Outcome: Progressing   Problem: Activity: Goal: Risk for activity intolerance will decrease Outcome: Progressing   Problem: Nutrition: Goal: Adequate nutrition will be maintained Outcome: Progressing   Problem: Coping: Goal: Level of anxiety will decrease Outcome: Progressing   Problem: Elimination: Goal: Will not experience complications related to bowel motility Outcome: Progressing Goal: Will not experience complications related to urinary retention Outcome: Progressing   Problem: Pain  Managment: Goal: General experience of comfort will improve Outcome: Progressing   Problem: Safety: Goal: Ability to remain free from injury will improve Outcome: Progressing   Problem: Skin Integrity: Goal: Risk for impaired skin integrity will decrease Outcome: Progressing
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
## 2019-04-11 NOTE — Plan of Care (Signed)

## 2019-04-12 ENCOUNTER — Inpatient Hospital Stay (HOSPITAL_COMMUNITY): Payer: No Typology Code available for payment source

## 2019-04-12 LAB — CBC
HCT: 27.9 % — ABNORMAL LOW (ref 39.0–52.0)
Hemoglobin: 9.4 g/dL — ABNORMAL LOW (ref 13.0–17.0)
MCH: 28.5 pg (ref 26.0–34.0)
MCHC: 33.7 g/dL (ref 30.0–36.0)
MCV: 84.5 fL (ref 80.0–100.0)
Platelets: 538 10*3/uL — ABNORMAL HIGH (ref 150–400)
RBC: 3.3 MIL/uL — ABNORMAL LOW (ref 4.22–5.81)
RDW: 15.9 % — ABNORMAL HIGH (ref 11.5–15.5)
WBC: 11.1 10*3/uL — ABNORMAL HIGH (ref 4.0–10.5)
nRBC: 0 % (ref 0.0–0.2)

## 2019-04-12 LAB — GLUCOSE, CAPILLARY: Glucose-Capillary: 119 mg/dL — ABNORMAL HIGH (ref 70–99)

## 2019-04-12 MED ORDER — BACITRACIN ZINC 500 UNIT/GM EX OINT
TOPICAL_OINTMENT | Freq: Two times a day (BID) | CUTANEOUS | Status: DC
  Filled 2019-04-12 (×3): qty 28.35

## 2019-04-12 NOTE — Progress Notes (Signed)
Modified Barium Swallow Progress Note  Patient Details  Name: John Nielsen. MRN: [id number] Date of Birth: 1985-11-03  Today's Date: 04/12/2019  Modified Barium Swallow completed.  Full report located under Chart Review in the Imaging Section.  Brief recommendations include the following:  Clinical Impression Paitient presents with mild-moderate oropharyngeal dysphagia across all consistencies secondary to decreased bolus cohesion and reduced laryngeal closure. Oral phase was remarkable for decreased bolus cohesion and reduced lingual control resulting in premature spillage to the vallecula and pyrifrom sinuses. Pharyngeal phase was remarkable for reduced BOT retraction resulting in vallecular residue, reduced hyolaryngeal excursion resulting in residue in the pyriform sinuses, and reduced laryngeal closure resulting in penetration. Penetration occurred with both thin and nectar thick liquids; however never resulted in aspiration. Pt was allowed to self pace during study and cleared penetrates from laryngeal vestibule with Mod I. As pt utilized a straw, penetration was further reduced given improved oral control. Regular solids and thin liquids are recommended with a slow rate, small bites and sips, and utiltizes a straw for liquids.  Swallow Evaluation Recommendations       SLP Diet Recommendations: Regular solids;Thin liquid   Liquid Administration via: Straw   Medication Administration: Crushed with puree   Supervision: Staff to assist with self feeding;Full supervision/cueing for compensatory strategies   Compensations: Slow rate;Small sips/bites;Minimize environmental distractions   Postural Changes: Remain semi-upright after after feeds/meals (Comment);Seated upright at 90 degrees   Oral Care Recommendations: Oral care BID        Cathi Roan 04/12/2019,1:03 PM
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
## 2019-04-12 NOTE — Progress Notes (Signed)
PT Cancellation Note  Patient Details Name: John Nielsen. MRN: [id number] DOB: 11-29-1985   Cancelled Treatment:    Reason Eval/Treat Not Completed: Patient at procedure or test/unavailable   Will follow up later today as time allows;  Otherwise, will follow up for PT tomorrow;   Thank you,  John Nielsen, PT  Acute Rehabilitation Services Pager [phone] Office [phone]     [name] 04/12/2019, 11:51 AM

## 2019-04-12 NOTE — Progress Notes (Signed)
OT Cancellation Note  Patient Details Name: John Nielsen. MRN: [id number] DOB: June 30, 1985   Cancelled Treatment:    Reason Eval/Treat Not Completed: Patient at procedure or test/ unavailable([facility]BS )  [name], OTR/L  Acute Rehabilitation Services Pager: [phone] Office: [phone] .  04/12/2019, 12:14 PM

## 2019-04-12 NOTE — Plan of Care (Signed)

## 2019-04-13 ENCOUNTER — Inpatient Hospital Stay (HOSPITAL_COMMUNITY): Payer: No Typology Code available for payment source

## 2019-04-13 NOTE — Progress Notes (Signed)
   04/13/19 0300  What Happened  Was fall witnessed? Yes  Who witnessed fall? Dickie La, NT/Sitter  Patients activity before fall bathroom-unassisted  Point of contact other (comment) (chest/some part of face)  Was patient injured? No  Follow Up  MD notified Marin Olp  Time MD notified [phone]  Family notified Yes - comment (no answer at first)  Time family notified [phone]  Additional tests No  Simple treatment Other (comment) (N/A)  Progress note created (see row info) Yes  Adult Fall Risk Assessment  Risk Factor Category (scoring not indicated) Fall has occurred during this admission (document High fall risk)  Age 34  Fall History: Fall within 6 months prior to admission 0  Elimination; Bowel and/or Urine Incontinence 0  Elimination; Bowel and/or Urine Urgency/Frequency 2  Medications: includes PCA/Opiates, Anti-convulsants, Anti-hypertensives, Diuretics, Hypnotics, Laxatives, Sedatives, and Psychotropics 5  Patient Care Equipment 0  Mobility-Assistance 2  Mobility-Gait 2  Mobility-Sensory Deficit 0  Altered awareness of immediate physical environment 1  Impulsiveness 2  Lack of understanding of one's physical/cognitive limitations 4  Total Score 18  Patient Fall Risk Level High fall risk  Adult Fall Risk Interventions  Required Bundle Interventions *See Row Information* High fall risk - low, moderate, and high requirements implemented  Additional Interventions PT/OT need assessed if change in mobility from baseline;Reorient/diversional activities with confused patients;Safety Sitter/Safety Rounder;Use of appropriate toileting equipment (bedpan, BSC, etc.)  Screening for Fall Injury Risk (To be completed on HIGH fall risk patients) - Assessing Need for Low Bed  Risk For Fall Injury- Low Bed Criteria None identified - Continue screening  Screening for Fall Injury Risk (To be completed on HIGH fall risk patients who do not meet crieteria for Low Bed) - Assessing Need for  Floor Mats Only  Risk For Fall Injury- Criteria for Floor Mats None identified - No additional interventions needed  Will Implement Floor Mats Yes

## 2019-04-13 NOTE — Progress Notes (Signed)
CSW has spoke with Berneice Heinrich, NP. Patient is not appropriate for [facility] due to having a TBI. CSW spoke with [facility]. CSW relayed this information and provided a list of facilities that the patient could be faxed out to.  CSW is signing off at this time.   Drucilla Schmidt, MSW, LCSW-A Clinical Disposition Social Worker [facility]/TTS [phone]
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
## 2019-04-13 NOTE — Plan of Care (Signed)
  Problem: Activity: Goal: Ability to perform activities at highest level will improve Outcome: Progressing Goal: Ability to avoid complications of mobility impairment will improve Outcome: Progressing Goal: Ability to tolerate increased activity will improve Outcome: Progressing Goal: Will remain free from falls Outcome: Progressing   

## 2019-04-13 NOTE — TOC Progression Note (Signed)
Transition of Care [facility]) - Progression Note    Patient Details  Name: John Nielsen. MRN: [id number] Date of Birth: 1985/03/27  [facility] CM/SW Contact  Glennon Mac, RN Phone Number: 04/13/2019, 4:19 PM  Clinical Narrative:  Pt now on regular diet with thin liquids, and able to walk 500 feet with therapy today.  Have asked [facility] to review patient again for possible inpatient psych admission.  Will follow with updates as available.       Expected Discharge Plan: Psychiatric Hospital Barriers to Discharge: Continued Medical Work up  Expected Discharge Plan and Services Expected Discharge Plan: Psychiatric Hospital   Discharge Planning Services: CM Consult   Living arrangements for the past 2 months: Single Family Home                                       Social Determinants of Health (SDOH) Interventions    Readmission Risk Interventions No flowsheet data found.  Quintella Baton, RN, BSN  Trauma/Neuro ICU Case Manager [phone]

## 2019-04-13 NOTE — Plan of Care (Signed)
  Problem: Education: Goal: Knowledge of General Education information will improve Description: Including pain rating scale, medication(s)/side effects and non-pharmacologic comfort measures Outcome: Progressing   Problem: Health Behavior/Discharge Planning: Goal: Ability to manage health-related needs will improve Outcome: Progressing   Problem: Clinical Measurements: Goal: Ability to maintain clinical measurements within normal limits will improve Outcome: Progressing Goal: Respiratory complications will improve Outcome: Progressing Goal: Cardiovascular complication will be avoided Outcome: Progressing   Problem: Activity: Goal: Risk for activity intolerance will decrease Outcome: Progressing   Problem: Pain Managment: Goal: General experience of comfort will improve Outcome: Progressing   Problem: Safety: Goal: Ability to remain free from injury will improve Outcome: Progressing   Problem: Skin Integrity: Goal: Risk for impaired skin integrity will decrease Outcome: Progressing   

## 2019-04-14 LAB — CBC
HCT: 29.4 % — ABNORMAL LOW (ref 39.0–52.0)
Hemoglobin: 9.6 g/dL — ABNORMAL LOW (ref 13.0–17.0)
MCH: 28.4 pg (ref 26.0–34.0)
MCHC: 32.7 g/dL (ref 30.0–36.0)
MCV: 87 fL (ref 80.0–100.0)
Platelets: 645 10*3/uL — ABNORMAL HIGH (ref 150–400)
RBC: 3.38 MIL/uL — ABNORMAL LOW (ref 4.22–5.81)
RDW: 16.7 % — ABNORMAL HIGH (ref 11.5–15.5)
WBC: 8.6 10*3/uL (ref 4.0–10.5)
nRBC: 0 % (ref 0.0–0.2)

## 2019-04-14 MED ORDER — ACETAMINOPHEN 325 MG PO TABS
650.0000 mg | ORAL_TABLET | Freq: Four times a day (QID) | ORAL | Status: DC | PRN
  Administered 2019-04-14 – 2019-04-25 (×8): 650 mg via ORAL
  Filled 2019-04-14 (×7): qty 2
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
## 2019-04-14 NOTE — Progress Notes (Signed)
Attempted to assess the patient in person with no success.  He had been sleeping per his sitter and was not awakening except to mumble despite both her and this provider attempting.  Psych will continue to try and assess the client.  Nanine Means, PMHNP
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
## 2019-04-14 NOTE — TOC Progression Note (Signed)
Transition of Care [facility]) - Progression Note    Patient Details  Name: John Nielsen. MRN: [id number] Date of Birth: 1985-05-21  Transition of Care [facility]) CM/SW Contact  Glennon Mac, RN Phone Number: 04/14/2019, 4:37 PM  Clinical Narrative:  Pt declined by [facility] for admission; they decline as pt has TBI, and they cannot accommodate.  Will fax referral to [facility]; spoke with [facility]:  They are accepting referrals for medical unit now, but suggest that psych MD see pt prior to referral being made.  Notified PA who will request psych visit.    Expected Discharge Plan: [facility]iatric Hospital Barriers to Discharge: Continued Medical Work up  Expected Discharge Plan and Services Expected Discharge Plan: [facility]iatric Hospital   Discharge Planning Services: CM Consult   Living arrangements for the past 2 months: Single Family Home                                       Social Determinants of Health (SDOH) Interventions    Readmission Risk Interventions No flowsheet data found.  Quintella Baton, RN, BSN  Trauma/Neuro ICU Case Manager [phone]

## 2019-04-15 DIAGNOSIS — F430 Acute stress reaction: Secondary | ICD-10-CM

## 2019-04-15 MED ORDER — QUETIAPINE FUMARATE 50 MG PO TABS
100.0000 mg | ORAL_TABLET | Freq: Every day | ORAL | Status: DC
  Administered 2019-04-15 – 2019-05-02 (×18): 100 mg via ORAL
  Filled 2019-04-15 (×18): qty 2

## 2019-04-15 NOTE — Plan of Care (Signed)
Pt resting in bed upon shift change. Sitter at bedside. Call bell in reach of pt. Plan of care reviewed with pt and mother. Video call conducted with Psych.  Pt adlib with standby assist. Incisions CDI.  Pt stable at this time, will continue to monitor.  Problem: Education: Goal: Ability to incorporate positive changes in behavior to improve self-esteem will improve 04/15/2019 1428 by Juliane Poot, RN Outcome: Progressing 04/15/2019 1428 by Juliane Poot, RN Outcome: Progressing   Problem: Health Behavior/Discharge Planning: Goal: Ability to identify and utilize available resources and services will improve 04/15/2019 1428 by Juliane Poot, RN Outcome: Progressing 04/15/2019 1428 by Juliane Poot, RN Outcome: Progressing Goal: Ability to remain free from injury will improve 04/15/2019 1428 by Juliane Poot, RN Outcome: Progressing 04/15/2019 1428 by Juliane Poot, RN Outcome: Progressing   Problem: Self-Concept: Goal: Will verbalize positive feelings about self 04/15/2019 1428 by Juliane Poot, RN Outcome: Progressing 04/15/2019 1428 by Juliane Poot, RN Outcome: Progressing   Problem: Skin Integrity: Goal: Demonstration of wound healing without infection will improve 04/15/2019 1428 by Juliane Poot, RN Outcome: Progressing 04/15/2019 1428 by Juliane Poot, RN Outcome: Progressing   Problem: Education: Goal: Ability to make informed decisions regarding treatment will improve 04/15/2019 1428 by Juliane Poot, RN Outcome: Progressing 04/15/2019 1428 by Juliane Poot, RN Outcome: Progressing   Problem: Coping: Goal: Coping ability will improve 04/15/2019 1428 by Juliane Poot, RN Outcome: Progressing 04/15/2019 1428 by Juliane Poot, RN Outcome: Progressing   Problem: Health Behavior/Discharge Planning: Goal: Identification of resources available to assist in meeting health care needs will improve 04/15/2019 1428 by Juliane Poot, RN Outcome:  Progressing 04/15/2019 1428 by Juliane Poot, RN Outcome: Progressing   Problem: Medication: Goal: Compliance with prescribed medication regimen will improve 04/15/2019 1428 by Juliane Poot, RN Outcome: Progressing 04/15/2019 1428 by Juliane Poot, RN Outcome: Progressing   Problem: Self-Concept: Goal: Ability to disclose and discuss suicidal ideas will improve 04/15/2019 1428 by Juliane Poot, RN Outcome: Progressing 04/15/2019 1428 by Juliane Poot, RN Outcome: Progressing Goal: Will verbalize positive feelings about self 04/15/2019 1428 by Juliane Poot, RN Outcome: Progressing 04/15/2019 1428 by Juliane Poot, RN Outcome: Progressing

## 2019-04-15 NOTE — Progress Notes (Signed)
Patient ID: John Nielsen., male   DOB: Jan 16, 1986, 34 y.o.   MRN: [id number] [facility] Progress Note:   12 Days Post-Op  Subjective: Mental status is talking on phone with his mother.  Sitting at bedside.   Objective: Vital signs in last 24 hours: Temp:  [98 F (36.7 C)-98.8 F (37.1 C)] 98 F (36.7 C) (01/30 0428) Pulse Rate:  [99-106] 99 (01/30 0428) Resp:  [15-18] 15 (01/30 0428) BP: (110-116)/(63-76) 111/67 (01/30 0428) SpO2:  [100 %] 100 % (01/30 0428) Weight:  [60.8 kg] 60.8 kg (01/30 0500)  Intake/Output from previous day: 01/29 0701 - 01/30 0700 In: 483 [P.O.:480; I.V.:3] Out: 1085 [Urine:1085] Intake/Output this shift: No intake/output data recorded.  Physical Exam: Work of breathing is normal.   Lab Results:  Results for orders placed or performed during the hospital encounter of 04/02/19 (from the past 48 hour(s))  CBC     Status: Abnormal   Collection Time: 04/14/19  6:32 AM  Result Value Ref Range   WBC 8.6 4.0 - 10.5 K/uL   RBC 3.38 (L) 4.22 - 5.81 MIL/uL   Hemoglobin 9.6 (L) 13.0 - 17.0 g/dL   HCT 28.3 (L) 66.2 - 94.7 %   MCV 87.0 80.0 - 100.0 fL   MCH 28.4 26.0 - 34.0 pg   MCHC 32.7 30.0 - 36.0 g/dL   RDW 65.4 (H) 65.0 - 35.4 %   Platelets 645 (H) 150 - 400 K/uL   nRBC 0.0 0.0 - 0.2 %    Comment: Performed at [facility], [address], [place], [place] [address]    Radiology/Results: No results found.  Anti-infectives: Anti-infectives (From admission, onward)   Start     Dose/Rate Route Frequency Ordered Stop   04/02/19 0300  ceFAZolin (ANCEF) IVPB 2g/100 mL premix  Status:  Discontinued     2 g 200 mL/hr over 30 Minutes Intravenous Every 8 hours 04/02/19 0239 04/02/19 0256   04/02/19 0300  ceFAZolin (ANCEF) IVPB 2g/100 mL premix     2 g 200 mL/hr over 30 Minutes Intravenous  Once 04/02/19 0256 04/03/19 1530      Assessment/Plan: Problem List: Patient Active Problem List   Diagnosis Date Noted  . ICH  (intracerebral hemorrhage) (HCC) 04/02/2019    To have behavior health consult by phone today at 10:30 to discuss placement.   12 Days Post-Op    LOS: 13 days   Matt B. Daphine Deutscher, MD, [facility] [phone] beeper [phone]  04/15/2019 9:33 AM

## 2019-04-15 NOTE — Progress Notes (Signed)
Pt lost balance in bathroom getting off of commode. Sitter was not present in bathroom. Sitter was outside of bathroom, heard a thump and found pt on floor. Pt claimed he hit the back of his head. Pt denies pain. RN was called to room. MD was paged and stated pt can be monitored. No further interventions need to be made. RN did focused assessment. Motor responses intact. PERRLA met. Neuro intact. Agricultural consultant notified, huddle performed. Will continue to monitor pt.
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
## 2019-04-16 MED ORDER — CLONAZEPAM 0.125 MG PO TBDP
0.2500 mg | ORAL_TABLET | Freq: Two times a day (BID) | ORAL | Status: DC
  Administered 2019-04-16: 0.25 mg via ORAL
  Filled 2019-04-16: qty 2

## 2019-04-16 MED ORDER — CLONAZEPAM 0.5 MG PO TABS: 0.2500 mg | ORAL_TABLET | Freq: Two times a day (BID) | ORAL | Status: DC

## 2019-04-16 MED ORDER — FLUOXETINE HCL 20 MG PO CAPS
20.0000 mg | ORAL_CAPSULE | Freq: Every day | ORAL | Status: DC
  Administered 2019-04-16 – 2019-05-03 (×18): 20 mg via ORAL
  Filled 2019-04-16 (×18): qty 1

## 2019-04-16 NOTE — Progress Notes (Signed)
Patient ID: John Nielsen., male   DOB: Aug 14, 1985, 34 y.o.   MRN: [id number] [facility] Progress Note:   13 Days Post-Op  Subjective: Mental status is unchanged.  Resting at present.  Sitter in the room Objective: Vital signs in last 24 hours: Temp:  [98.5 F (36.9 C)-100.4 F (38 C)] 98.9 F (37.2 C) (01/31 0538) Pulse Rate:  [96-107] 96 (01/31 0538) Resp:  [16-18] 16 (01/31 0538) BP: (96-124)/(57-83) 96/57 (01/31 0538) SpO2:  [100 %] 100 % (01/31 0538)  Intake/Output from previous day: 01/30 0701 - 01/31 0700 In: 200 [P.O.:200] Out: 400 [Urine:400] Intake/Output this shift: Total I/O In: 240 [P.O.:240] Out: -   Physical Exam: Work of breathing is not labored.  No complaints  Lab Results:  No results found for this or any previous visit (from the past 48 hour(s)).  Radiology/Results: No results found.  Anti-infectives: Anti-infectives (From admission, onward)   Start     Dose/Rate Route Frequency Ordered Stop   04/02/19 0300  ceFAZolin (ANCEF) IVPB 2g/100 mL premix  Status:  Discontinued     2 g 200 mL/hr over 30 Minutes Intravenous Every 8 hours 04/02/19 0239 04/02/19 0256   04/02/19 0300  ceFAZolin (ANCEF) IVPB 2g/100 mL premix     2 g 200 mL/hr over 30 Minutes Intravenous  Once 04/02/19 0256 04/03/19 1530      Assessment/Plan: Problem List: Patient Active Problem List   Diagnosis Date Noted  . Acute stress disorder 04/15/2019  . ICH (intracerebral hemorrhage) (HCC) 04/02/2019    Will check CXR and CBC in AM.  May be cleared for transfer to inpatient psych this week.   13 Days Post-Op    LOS: 14 days   Matt B. Daphine Deutscher, MD, [facility] [phone] beeper [phone]  04/16/2019 9:08 AM

## 2019-04-17 ENCOUNTER — Encounter (HOSPITAL_COMMUNITY): Payer: Self-pay

## 2019-04-17 ENCOUNTER — Inpatient Hospital Stay (HOSPITAL_COMMUNITY): Payer: No Typology Code available for payment source

## 2019-04-17 LAB — CBC WITH DIFFERENTIAL/PLATELET
Abs Immature Granulocytes: 0.35 10*3/uL — ABNORMAL HIGH (ref 0.00–0.07)
Basophils Absolute: 0.1 10*3/uL (ref 0.0–0.1)
Basophils Relative: 1 %
Eosinophils Absolute: 0.5 10*3/uL (ref 0.0–0.5)
Eosinophils Relative: 6 %
HCT: 28.7 % — ABNORMAL LOW (ref 39.0–52.0)
Hemoglobin: 9.5 g/dL — ABNORMAL LOW (ref 13.0–17.0)
Immature Granulocytes: 4 %
Lymphocytes Relative: 21 %
Lymphs Abs: 1.8 10*3/uL (ref 0.7–4.0)
MCH: 28.2 pg (ref 26.0–34.0)
MCHC: 33.1 g/dL (ref 30.0–36.0)
MCV: 85.2 fL (ref 80.0–100.0)
Monocytes Absolute: 1 10*3/uL (ref 0.1–1.0)
Monocytes Relative: 11 %
Neutro Abs: 4.9 10*3/uL (ref 1.7–7.7)
Neutrophils Relative %: 57 %
Platelets: 704 10*3/uL — ABNORMAL HIGH (ref 150–400)
RBC: 3.37 MIL/uL — ABNORMAL LOW (ref 4.22–5.81)
RDW: 17 % — ABNORMAL HIGH (ref 11.5–15.5)
WBC: 8.5 10*3/uL (ref 4.0–10.5)
nRBC: 0.2 % (ref 0.0–0.2)

## 2019-04-17 MED ORDER — CLONAZEPAM 0.125 MG PO TBDP
0.2500 mg | ORAL_TABLET | Freq: Every day | ORAL | Status: DC
  Administered 2019-04-17 – 2019-04-19 (×3): 0.25 mg via ORAL
  Filled 2019-04-17 (×3): qty 2
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
## 2019-04-17 NOTE — Progress Notes (Signed)
Orthopedic Tech Progress Note Patient Details:  John Nielsen. [birth year] [id number]  Ortho Devices Type of Ortho Device: Velcro wrist forearm splint Ortho Device/Splint Location: LUE Ortho Device/Splint Interventions: Application, Ordered   Post Interventions Patient Tolerated: Well Instructions Provided: Care of device, Adjustment of device   Donald Pore 04/17/2019, 10:34 AM
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
## 2019-04-17 NOTE — Plan of Care (Signed)
  Problem: Activity: Goal: Ability to perform activities at highest level will improve Outcome: Progressing Goal: Ability to avoid complications of mobility impairment will improve Outcome: Progressing Goal: Ability to tolerate increased activity will improve Outcome: Progressing Goal: Will remain free from falls Outcome: Progressing   

## 2019-04-18 ENCOUNTER — Inpatient Hospital Stay (HOSPITAL_COMMUNITY): Payer: No Typology Code available for payment source

## 2019-04-18 NOTE — Progress Notes (Signed)
Orthopedic Tech Progress Note Patient Details:  John Nielsen. 1986-03-14 [id number]  Ortho Devices Type of Ortho Device: Postop shoe/boot Ortho Device/Splint Location: LUE Ortho Device/Splint Interventions: Application   Post Interventions Patient Tolerated: Well Instructions Provided: Care of device   Saul Fordyce 04/18/2019, 2:36 PM

## 2019-04-18 NOTE — Progress Notes (Signed)
R foot xrays without fracture/dislocation.  May WBAT, but hard-soled shoe may be symptomatically helpful--will order.  Neil Crouch, MD Hand Surgery [facility] [phone]

## 2019-04-18 NOTE — Progress Notes (Signed)
Inpatient Rehab Admissions Coordinator:   Re-screened pt for CIR now that pt no longer meets criteria for inpatient psychiatric admission.  Pt will need to have 24/7 supervision/assist at discharge from CIR.  Will place an order so we can evaluate patient more thoroughly.   Estill Dooms, PT, DPT Admissions Coordinator [phone] 04/18/19  12:47 PM
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
## 2019-04-18 NOTE — Progress Notes (Signed)
Inpatient Rehab Admissions:  Inpatient Rehab Consult received.  I met with patient at the bedside for rehabilitation assessment and to discuss goals and expectations of an inpatient rehab admission.  Pt tracks conversation well, and answers biographical information accurately as far as I can tell.  States he lives with his mom, who works some days, but that he has a sister that can stay with him during the day when mom is at work.  Currently, pt mobilizing extremely well, and with supervision goals from CIR may not qualify.  However, I would like to speak to his mother regarding available support at discharge.  I've left her a message and will continue to follow.   Signed: Shann Medal, PT, DPT Admissions Coordinator [phone] 04/18/19  3:25 PM
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
## 2019-04-19 LAB — RAPID HIV SCREEN (HIV 1/2 AB+AG)
HIV 1/2 Antibodies: NONREACTIVE
HIV-1 P24 Antigen - HIV24: NONREACTIVE

## 2019-04-19 LAB — HEPATITIS B SURFACE ANTIGEN: Hepatitis B Surface Ag: NONREACTIVE

## 2019-04-19 NOTE — TOC Progression Note (Signed)
Transition of Care [facility]) - Progression Note    Patient Details  Name: John Nielsen. MRN: [id number] Date of Birth: [birth year]  [facility] CM/SW Contact  Ella Bodo, RN Phone Number: 04/19/2019, 5:19 PM  Clinical Narrative:  PT/OT continuing to recommend CIR, but pt does not have 24/7 support at discharge from family.  I met with pt, with mother Lenna Sciara on speaker phone; she adamantly states that neither mother or father can provide 24h care at dc, and that he needs rehab at dc.  Per mother, financial counseling has submitted Medicaid application.  She states that she lives on the 2nd floor of an apt and is gone all day at work.  Pt with poor balance and cognitive deficits, per PT/OT.  Pt may be eligible for LOG SNF; discussed with pt/mother.  Mother prefers this option, if possible, until pt is ambulating better.  Will ask for LOG SNF approval from [facility] director, and fax out for bed accordingly.   SBIRT completed; pt admits to occasional ETOH use, but denies need for cessation resources.   Barriers to Discharge: Continued Medical Work up  Expected Discharge Plan and Services Expected Discharge Plan: [facility]   Discharge Planning Services: CM Consult   Living arrangements for the past 2 months: Single Family Home                                       Social Determinants of Health (SDOH) Interventions    Readmission Risk Interventions No flowsheet data found.   Reinaldo Raddle, RN, BSN  Trauma/Neuro ICU Case Manager [phone]
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
## 2019-04-19 NOTE — Progress Notes (Signed)
     Subjective: Following up with patient regarding left anterior superior iliac spine fracture. Patient lying comfortably in bed. Able to sit up on side of bed without issue. Minimal left sided pelvis pain.   Objective:   VITALS:   Vitals:   04/18/19 0531 04/18/19 0532 04/18/19 1404 04/18/19 2010  BP:  108/65 115/73 119/69  Pulse:  (!) 101 (!) 106 (!) 105  Resp:  18 18 19   Temp: 98.5 F (36.9 C) 98.5 F (36.9 C) 98.7 F (37.1 C) 98.9 F (37.2 C)  TempSrc: Oral Oral Oral Oral  SpO2:  100% 93% 100%  Weight:      Height:       Physical Exam: General: Alert and awake, seated on side of bed. Engaged in conversation, in no acute distress. Abd: Soft, non-distended. ULQ tenderness RLE: Mild TTP to left iliac spine. Sensation and pulses intact distally. Dorsiflexion and Plantarflexion intact. No BLE edema.   Lab Results  Component Value Date   WBC 8.5 04/17/2019   HGB 9.5 (L) 04/17/2019   HCT 28.7 (L) 04/17/2019   MCV 85.2 04/17/2019   PLT 704 (H) 04/17/2019   BMET    Component Value Date/Time   NA 138 04/08/2019 0732   K 3.7 04/08/2019 0732   CL 108 04/08/2019 0732   CO2 21 (L) 04/08/2019 0732   GLUCOSE 125 (H) 04/08/2019 0732   BUN 13 04/08/2019 0732   CREATININE 0.72 04/08/2019 0732   CALCIUM 8.2 (L) 04/08/2019 0732   GFRNONAA >60 04/08/2019 0732   GFRAA >60 04/08/2019 0732     Assessment/Plan: 16 Days Post-Op   Principal Problem:   Acute stress disorder Active Problems:   ICH (intracerebral hemorrhage) (HCC)  Left anterior superior iliac spine fracture - x-rays performed yesterday show some callous formation - will continue to treat non-operatively - continue PT with gait exercises, from report patient is mobilizing well and engaged in therapy - Follow-up with Dr. 04/10/2019 1-2 weeks after discharge   WHITAKER HOLDERMAN 04/19/2019, 8:24 AM   06/17/2019, MD Cell [phone]

## 2019-04-19 NOTE — Plan of Care (Signed)
  Problem: Pain Managment: Goal: General experience of comfort will improve Outcome: Progressing   Problem: Safety: Goal: Ability to remain free from injury will improve Outcome: Progressing   Problem: Skin Integrity: Goal: Risk for impaired skin integrity will decrease Outcome: Progressing   

## 2019-04-19 NOTE — Progress Notes (Signed)
Inpatient Rehab Admissions Coordinator:   I was able to speak with pt's father and mother.  Both work during the day (as does his step mother).  Per both parents, there is no one available to provide supervision 24/7 to [facility] after discharge from acute or CIR.  Will need to pursue another venue of care.    Estill Dooms, PT, DPT Admissions Coordinator [phone] 04/19/19  12:51 PM
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
## 2019-04-20 MED ORDER — CLONAZEPAM 0.125 MG PO TBDP: 0.2500 mg | ORAL_TABLET | Freq: Every day | ORAL | Status: DC | PRN
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
## 2019-04-20 NOTE — Progress Notes (Signed)
  Speech Language Pathology Treatment: Cognitive-Linquistic  Patient Details Name: John Nielsen. MRN: [id number] DOB: 1986/01/20 Today's Date: 04/20/2019 Time: 2111-5520 SLP Time Calculation (min) (ACUTE ONLY): 28 min  Assessment / Plan / Recommendation Clinical Impression  Pt received in room for skilled ST targeting cognitive-linguistic goals. Patient presents with improved awareness, continues to have difficulty with delayed recall. Patient oriented to self, place, month, date, year and reason for hospitalization. Pt reports he spoke with his mother via telephone earlier and she informed him of the month/date and he was able to recall it.   In verbal delayed recall task, patient able to recall 2/5 words, increased to 4/5 given category cue. In visual recall task, patient able to recall 3/3 items after a delay of 5 minutes. Pt was not able to recall item in prospective memory task. ST provided edu to patient re: compensatory strategies for recall including visualization, repetition and writing. Patient able to verbalize safety awareness related to using call bell with min cues. He was able to verbally list reasons he may need to ask for assistance including to get up, to get help, if he needs medication, if he is in pain. Pt verbalizing awareness that he should not get up out of bed by himself in case he falls.  Pt demonstrated improved insight into situation this date, stating "I need someone to be with me all the time for at least six months or so, my mom goes to work during the day and lives on the second floor, so that won't work. We're trying to find somewhere I can go."  ST to follow as per POC.  HPI        SLP Plan  Continue with current plan of care       Recommendations                   Follow up Recommendations: Inpatient Rehab SLP Visit Diagnosis: Cognitive communication deficit (E02.233) Plan: Continue with current plan of care       GO               Shella Spearing, M.Ed., CCC-SLP Speech Therapy Acute Rehabilitation [phone]: Acute Rehab office [phone] - pager    Shella Spearing 04/20/2019, 3:39 PM

## 2019-04-21 MED ORDER — OXYCODONE HCL 5 MG PO TABS
5.0000 mg | ORAL_TABLET | Freq: Four times a day (QID) | ORAL | Status: DC | PRN
  Administered 2019-04-21 – 2019-05-02 (×4): 5 mg via ORAL
  Filled 2019-04-21 (×4): qty 1

## 2019-04-21 NOTE — Progress Notes (Deleted)
30 day LOG; please contact Sidney Ace, RN, BSN [phone]

## 2019-04-21 NOTE — TOC Progression Note (Signed)
Transition of Care [facility]) - Progression Note    Patient Details  Name: John Nielsen. MRN: [id number] Date of Birth: Sep 18, 1985  [facility] CM/SW Contact  Glennon Mac, RN Phone Number: 04/21/2019, 5:30 PM  Clinical Narrative:  Pt has been approved for initiation of [facility] SNF.  FL2 initiated and faxed out for bed search.  Will follow with updates as available.       [facility] Barriers to Discharge: SNF Pending bed offer  Expected Discharge Plan and Services Expected Discharge Plan: [facility]   Discharge Planning Services: CM Consult   Living arrangements for the past 2 months: Single Family Home                                       Social Determinants of Health (SDOH) Interventions    Readmission Risk Interventions No flowsheet data found.  Quintella Baton, RN, BSN  Trauma/Neuro ICU Case Manager [phone]
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
## 2019-04-21 NOTE — NC FL2 (Signed)
[place] MEDICAID FL2 LEVEL OF CARE SCREENING TOOL     IDENTIFICATION  Patient Name: John Nielsen. Birthdate: 07/10/1985 Sex: male Admission Date (Current Location): 04/02/2019  [facility] and [place] Number:  Producer, television/film/video and Address:  [facility] [place], [address], [place], [place] [address]      Provider Number: [id number]  Attending Physician Name and Address:  Md, Trauma, MD  Relative Name and Phone Number:  Marla Roe, mother [phone])    Current Level of Care: Hospital Recommended Level of Care: Skilled Nursing Facility Prior Approval Number:    Date Approved/Denied:   PASRR Number:    Discharge Plan: SNF    Current Diagnoses: Patient Active Problem List   Diagnosis Date Noted  . Acute stress disorder 04/15/2019  . ICH (intracerebral hemorrhage) (HCC) 04/02/2019    Orientation RESPIRATION BLADDER Height & Weight     Self, Time, Place  Normal Continent Weight: 57.6 kg Height:  5\' 9"  (175.3 cm)  BEHAVIORAL SYMPTOMS/MOOD NEUROLOGICAL BOWEL NUTRITION STATUS      Continent Diet  AMBULATORY STATUS COMMUNICATION OF NEEDS Skin   Limited Assist Verbally Surgical wounds, Skin abrasions(Lt clavicle surgical wound, healing; sutured scalp wound)                       Personal Care Assistance Level of Assistance  Bathing, Feeding, Dressing Bathing Assistance: Limited assistance Feeding assistance: Independent Dressing Assistance: Limited assistance     Functional Limitations Info    Sight Info: Adequate Hearing Info: Adequate Speech Info: Adequate    SPECIAL CARE FACTORS FREQUENCY  PT (By licensed PT), OT (By licensed OT)     PT Frequency: 5-6 times/week OT Frequency: 5-6 times/week            Contractures Contractures Info: Not present    Additional Factors Info  Code Status, Allergies Code Status Info: Full Code Allergies Info: No known allergies           Current Medications (04/21/2019):   This is the current hospital active medication list Current Facility-Administered Medications  Medication Dose Route Frequency Provider Last Rate Last Admin  . 0.9 %  sodium chloride infusion   Intravenous PRN 06/19/2019, MD 10 mL/hr at 04/11/19 1401 250 mL at 04/11/19 1401  . acetaminophen (TYLENOL) tablet 650 mg  650 mg Oral Q6H PRN Rayburn, Kelly A, PA-C   650 mg at 04/20/19 1747  . bacitracin ointment   Topical BID 06/18/19, Jerre Simon   Given at 04/21/19 0940  . Chlorhexidine Gluconate Cloth 2 % PADS 6 each  6 each Topical Q0600 06/19/19, MD   6 each at 04/21/19 0940  . clonazepam (KLONOPIN) disintegrating tablet 0.25 mg  0.25 mg Oral Daily PRN Maczis, 06/19/19, PA-C      . docusate sodium (COLACE) capsule 100 mg  100 mg Oral Daily Focht, Jessica L, PA   100 mg at 04/19/19 0837  . enoxaparin (LOVENOX) injection 40 mg  40 mg Subcutaneous Q24H 06/17/19, MD   40 mg at 04/21/19 06/19/19  . FLUoxetine (PROZAC) capsule 20 mg  20 mg Oral Daily 0093, MD   20 mg at 04/21/19 0939  . haloperidol lactate (HALDOL) injection 5 mg  5 mg Intravenous Q6H PRN 06/19/19, MD   5 mg at 04/07/19 2045  . LORazepam (ATIVAN) injection 0.5 mg  0.5 mg Intravenous Q6H PRN 2046, PA-C   0.5  mg at 04/09/19 1050  . MEDLINE mouth rinse  15 mL Mouth Rinse q12n4p Jesusita Oka, MD   15 mL at 04/21/19 1627  . multivitamin with minerals tablet 1 tablet  1 tablet Oral Daily Jesusita Oka, MD   1 tablet at 04/21/19 0939  . nicotine (NICODERM CQ - dosed in mg/24 hours) patch 14 mg  14 mg Transdermal Daily Donnie Mesa, MD   14 mg at 04/21/19 6759  . ondansetron (ZOFRAN-ODT) disintegrating tablet 4 mg  4 mg Oral Q6H PRN Milly Jakob, MD       Or  . ondansetron [facility] [place]) injection 4 mg  4 mg Intravenous Q6H PRN Milly Jakob, MD      . oxyCODONE (Oxy IR/ROXICODONE) immediate release tablet 5 mg  5 mg Oral Q6H PRN Meuth, Brooke A, PA-C      . polyethylene glycol (MIRALAX /  GLYCOLAX) packet 17 g  17 g Oral Daily Focht, Jessica L, PA   17 g at 04/19/19 0837  . QUEtiapine (SEROQUEL) tablet 100 mg  100 mg Oral QHS Akintayo, Mojeed, MD   100 mg at 04/20/19 2146  . Resource ThickenUp Clear   Oral PRN Viona Gilmore D, NP      . sodium chloride flush (NS) 0.9 % injection 10-40 mL  10-40 mL Intracatheter Q12H Milly Jakob, MD   10 mL at 04/18/19 0900  . Tdap (BOOSTRIX) injection 0.5 mL  0.5 mL Intramuscular Once Milly Jakob, MD         Discharge Medications: Please see discharge summary for a list of discharge medications.  Relevant Imaging Results:  Relevant Lab Results:   Additional Information SS# [id number]; 30 day letter of guarantee   Reinaldo Raddle, RN, BSN  Trauma/Neuro ICU Case Manager [phone]

## 2019-04-22 MED ORDER — DIPHENHYDRAMINE HCL 25 MG PO CAPS: 25.0000 mg | ORAL_CAPSULE | Freq: Every evening | ORAL | Status: DC | PRN

## 2019-04-22 NOTE — Plan of Care (Signed)
  Problem: Education: Goal: Knowledge of General Education information will improve Description: Including pain rating scale, medication(s)/side effects and non-pharmacologic comfort measures Outcome: Progressing   Problem: Health Behavior/Discharge Planning: Goal: Ability to manage health-related needs will improve Outcome: Progressing   Problem: Activity: Goal: Risk for activity intolerance will decrease Outcome: Progressing   Problem: Nutrition: Goal: Adequate nutrition will be maintained Outcome: Progressing   Problem: Elimination: Goal: Will not experience complications related to urinary retention Outcome: Progressing   Problem: Pain Managment: Goal: General experience of comfort will improve Outcome: Progressing   Problem: Safety: Goal: Ability to remain free from injury will improve Outcome: Progressing   Problem: Skin Integrity: Goal: Risk for impaired skin integrity will decrease Outcome: Progressing   
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
## 2019-04-23 NOTE — Plan of Care (Signed)

## 2019-04-24 LAB — BASIC METABOLIC PANEL
Anion gap: 13 (ref 5–15)
BUN: 17 mg/dL (ref 6–20)
CO2: 25 mmol/L (ref 22–32)
Calcium: 9.3 mg/dL (ref 8.9–10.3)
Chloride: 101 mmol/L (ref 98–111)
Creatinine, Ser: 0.97 mg/dL (ref 0.61–1.24)
GFR calc Af Amer: >60 mL/min (ref >60)
GFR calc non Af Amer: >60 mL/min (ref >60)
Glucose, Bld: 99 mg/dL (ref 70–99)
Potassium: 4.6 mmol/L (ref 3.5–5.1)
Sodium: 139 mmol/L (ref 135–145)

## 2019-04-24 LAB — CBC
HCT: 30 % — ABNORMAL LOW (ref 39.0–52.0)
Hemoglobin: 10 g/dL — ABNORMAL LOW (ref 13.0–17.0)
MCH: 28.6 pg (ref 26.0–34.0)
MCHC: 33.3 g/dL (ref 30.0–36.0)
MCV: 85.7 fL (ref 80.0–100.0)
Platelets: 552 10*3/uL — ABNORMAL HIGH (ref 150–400)
RBC: 3.5 MIL/uL — ABNORMAL LOW (ref 4.22–5.81)
RDW: 17.9 % — ABNORMAL HIGH (ref 11.5–15.5)
WBC: 7.8 10*3/uL (ref 4.0–10.5)
nRBC: 0 % (ref 0.0–0.2)

## 2019-04-24 MED ORDER — LIDOCAINE 5 % EX PTCH
1.0000 | MEDICATED_PATCH | CUTANEOUS | Status: DC
  Administered 2019-04-24 – 2019-04-25 (×2): 1 via TRANSDERMAL
  Filled 2019-04-24 (×2): qty 1

## 2019-04-24 MED ORDER — METHOCARBAMOL 500 MG PO TABS
500.0000 mg | ORAL_TABLET | Freq: Four times a day (QID) | ORAL | Status: DC | PRN
  Administered 2019-04-30 – 2019-05-02 (×2): 500 mg via ORAL
  Filled 2019-04-24 (×2): qty 1
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
## 2019-04-26 MED ORDER — LIDOCAINE 5 % EX PTCH: 1.0000 | MEDICATED_PATCH | Freq: Every day | CUTANEOUS | Status: DC | PRN
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
## 2019-04-27 NOTE — TOC Progression Note (Signed)
Transition of Care [facility]) - Progression Note    Patient Details  Name: John Nielsen. MRN: [id number] Date of Birth: [birth year]  [facility]) CM/SW Contact  Glennon Mac, RN Phone Number: 04/27/2019, 4:52 PM  Clinical Narrative: Pt making good improvements with therapy; ambulated 800 feet with PT today, and independent in room.  Spoke with pt's father, Skanda, SR, and explained to him that pt is reaching intermittent supervision level, and we are likely not going to be able to place him in SNF with him walking so well.  He understands this.  He is a substance abuse counselor, and states he is working with a facility in [place] that may be able to take patient.  He plans to call a coworker to discuss this and get back to me.  I did let him know that should Derward continue to progress and only need intermittent supervision, he will be discharged with likely OP therapies.  His father understands this.        [facility] Barriers to Discharge: SNF Pending bed offer  Expected Discharge Plan and Services Ex[facility] Discharge Plan: [facility]   Discharge Planning Services: CM Consult   Living arrangements for the past 2 months: Single Family Home                                       Social Determinants of Health (SDOH) Interventions    Readmission Risk Interventions No flowsheet data found.  Quintella Baton, RN, BSN  Trauma/Neuro ICU Case Manager [phone]
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
## 2019-04-29 DIAGNOSIS — S36039A Unspecified laceration of spleen, initial encounter: Secondary | ICD-10-CM
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
## 2019-05-01 NOTE — Care Management (Addendum)
Pt now Mod I level with physical therapy; walking independently on nursing unit to get coffee.  Left message on both patient's mother and father's voice mail to discuss discharge plan.    1650 I have not received call back from pt's mother or father.    Quintella Baton, RN, BSN  Trauma/Neuro ICU Case Manager [phone]
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
## 2019-05-02 ENCOUNTER — Inpatient Hospital Stay (HOSPITAL_COMMUNITY): Payer: No Typology Code available for payment source

## 2019-05-02 LAB — BASIC METABOLIC PANEL
Anion gap: 12 (ref 5–15)
BUN: 16 mg/dL (ref 6–20)
CO2: 23 mmol/L (ref 22–32)
Calcium: 9.2 mg/dL (ref 8.9–10.3)
Chloride: 103 mmol/L (ref 98–111)
Creatinine, Ser: 0.88 mg/dL (ref 0.61–1.24)
GFR calc Af Amer: >60 mL/min (ref >60)
GFR calc non Af Amer: >60 mL/min (ref >60)
Glucose, Bld: 116 mg/dL — ABNORMAL HIGH (ref 70–99)
Potassium: 3.8 mmol/L (ref 3.5–5.1)
Sodium: 138 mmol/L (ref 135–145)

## 2019-05-02 LAB — CBC
HCT: 32.6 % — ABNORMAL LOW (ref 39.0–52.0)
Hemoglobin: 11 g/dL — ABNORMAL LOW (ref 13.0–17.0)
MCH: 28.8 pg (ref 26.0–34.0)
MCHC: 33.7 g/dL (ref 30.0–36.0)
MCV: 85.3 fL (ref 80.0–100.0)
Platelets: 360 10*3/uL (ref 150–400)
RBC: 3.82 MIL/uL — ABNORMAL LOW (ref 4.22–5.81)
RDW: 16.9 % — ABNORMAL HIGH (ref 11.5–15.5)
WBC: 8.9 10*3/uL (ref 4.0–10.5)
nRBC: 0 % (ref 0.0–0.2)

## 2019-05-02 MED ORDER — ENSURE ENLIVE PO LIQD
237.0000 mL | Freq: Two times a day (BID) | ORAL | Status: DC
  Administered 2019-05-02 (×2): 237 mL via ORAL

## 2019-05-02 NOTE — Progress Notes (Signed)
Pt wanting to go outside but I told him that he has a xray ordered for his pelvis and so to not go outside for right now until that is done and then we can go outside.
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
## 2019-05-03 MED ORDER — METHOCARBAMOL 500 MG PO TABS: 500.0000 mg | ORAL_TABLET | Freq: Four times a day (QID) | ORAL | 0 refills | Status: AC | PRN

## 2019-05-03 MED ORDER — QUETIAPINE FUMARATE 100 MG PO TABS: 100.0000 mg | ORAL_TABLET | Freq: Every day | ORAL | 0 refills | Status: AC

## 2019-05-03 MED ORDER — LIDOCAINE 5 % EX PTCH: 1.0000 | MEDICATED_PATCH | Freq: Every day | CUTANEOUS | 0 refills | Status: AC | PRN

## 2019-05-03 MED ORDER — ACETAMINOPHEN 325 MG PO TABS: 650.0000 mg | ORAL_TABLET | Freq: Four times a day (QID) | ORAL | Status: AC | PRN

## 2019-05-03 MED ORDER — FLUOXETINE HCL 20 MG PO CAPS: 20.0000 mg | ORAL_CAPSULE | Freq: Every day | ORAL | 0 refills | Status: AC

## 2019-05-03 MED ORDER — DOCUSATE SODIUM 100 MG PO CAPS: 100.0000 mg | ORAL_CAPSULE | Freq: Every day | ORAL | 0 refills | Status: AC | PRN

## 2019-05-03 MED FILL — LIDOCAINE PATCH 5%: 5 | 30 days supply | Qty: 30 | Fill #0

## 2019-05-03 MED FILL — QUETIAPINE FUMARATE 100 MG: 100 | 30 days supply | Qty: 30 | Fill #0

## 2019-05-03 MED FILL — FLUoxetine HCL 20 MG CAPS: 20 | 30 days supply | Qty: 30 | Fill #0

## 2019-05-03 MED FILL — METHOCARBAMOL 500 MG TABS: 500 | 15 days supply | Qty: 60 | Fill #0
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
## 2019-05-03 NOTE — Progress Notes (Signed)
Pt discharged to home with mom.  Pt had TOC meds upon discharge but left before I gave him DC paperwork.  Pt told secretary that he had everything.  Pt wanted to let everyone know how much he appreciated everything.
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
## 2019-05-03 NOTE — Discharge Instructions (Signed)
Living With Traumatic Brain Injury Traumatic brain injury (TBI) is an injury to the brain that may be mild, moderate, or severe. Symptoms of any type of TBI can be long lasting (chronic). Depending on the area of the brain that is affected, a TBI can interfere with vision, memory, concentration, speech, balance, sense of touch, and sleep. TBI can also cause chronic symptoms like headache or dizziness. How to cope with lifestyle changes After a TBI, you may need to make changes to your lifestyle in order to recover as well as possible. How quickly and how fully you recover will depend on the severity of your injury. Your recovery plan may involve:  Working with specialists to develop a rehabilitation plan to help you return to your regular activities. Your health care team may include: ? Physical or occupational therapists. ? Speech and language pathologists. ? Mental health counselors. ? Physicians like your primary care physician or neurologist.  Taking time off work or school, depending on your injury.  Avoiding situations where there is a risk for another head injury, such as football, hockey, soccer, basketball, martial arts, downhill snow sports, and horseback riding. Do not do these activities until your health care provider approves.  Resting. Rest helps the brain to heal. Make sure you: ? Get plenty of sleep at night. Avoid staying up late at night. ? Keep the same bedtime hours on weekends and weekdays. ? Rest during the day. Take daytime naps or rest breaks when you feel tired.  Avoiding extra stress on your eyes. You may need to set time limits when working on the computer, watching TV, and reading.  Finding ways to manage stress. This may include: ? Avoiding activities that cause stress. ? Deep breathing, yoga, or meditation. ? Listening to music or spending time outdoors.  Making lists, setting reminders, or using a day planner to help your memory.  Allowing yourself plenty  of time to complete everyday tasks, such as grocery shopping, paying bills, and doing laundry.  Avoiding driving. Your ability to drive safely may be affected by your injury. ? Rely on family, friends, or a transportation service to help you get around and to appointments. ? Have a professional evaluation to check your driving ability. ? Access support services to help you return to driving. These may include training and adaptive equipment. Follow these instructions at home:  Take over-the-counter and prescription medicines only as told by your health care provider. Do not take aspirin or other anti-inflammatory medicines such as ibuprofen or naproxen unless approved by your health care provider.  Avoid large amounts of caffeine. Your body may be more sensitive to it after your injury.  Do not use any products that contain nicotine or tobacco, such as cigarettes, e-cigarettes, nicotine gum, and patches. If you need help quitting, ask your health care provider.  Do not use drugs.  Limit alcohol intake to no more than 1 drink per day for nonpregnant women and 2 drinks per day for men. One drink equals 12 ounces of beer, 5 ounces of wine, or 1 ounces of hard liquor.  Do not drive until cleared by your health care provider.  Keep all follow-up visits as told by your health care provider. This is important. Where to find support  Talk with your employer, co-workers, teachers, or school counselor about your injury. Work together to develop a plan for completing tasks while you recover.  Talk to others living with a TBI. Join a support group with other people   who have experienced a TBI.  Let your friends and family members know what they can do to help. This might include helping at home or transportation to appointments.  If you are unable to continue working after your injury, talk to a social worker about options to help you meet your financial needs.  Seek out additional resources if  you are a military serviceman or family member, such as: ? [facility]: dvbic.dcoe.mil ? [employer] Military and Veterans Crisis Line: 1-800-273-8255 Questions to ask your health care provider:  How serious is my injury?  What is my rehabilitation plan?  What is my expected recovery?  When can I return to work or school?  When can I return to regular activities, including driving? Contact a health care provider if:  You have new or worsening: ? Dizziness. ? Headache. ? Anxiety or depression. ? Irritability. ? Confusion. ? Jerky movements that you cannot control (seizures). ? Extreme sensitivity to light or sound. ? Nausea or vomiting. Summary  Traumatic brain injury (TBI) is an injury to your brain that can interfere with vision, memory, concentration, speech, balance, sense of touch, and sleep. TBI can also cause chronic symptoms like headache or dizziness.  After a TBI you may need to make several changes to your lifestyle in order to recover as well as possible. How quickly and how fully you recover will depend on the severity of your injury.  Talk to your family, friends, employer, co-workers, teachers, or school counselor about your injury. Work together to develop a plan for completing tasks while you recover. This information is not intended to replace advice given to you by your health care provider. Make sure you discuss any questions you have with your health care provider. Document Revised: 06/24/2018 Document Reviewed: 02/27/2016 Elsevier Patient Education  2020 [employer]   Supporting Someone With Traumatic Brain Injury Traumatic brain injury (TBI) is an injury to the brain that results from a hard, direct hit to the head. It also results from whiplash or a direct blow to the head by an object. TBI can be mild, moderate, or severe. What do I need to know about this condition? Symptoms of any type of TBI can be  long lasting (chronic). Depending on the area of the brain that is affected, a TBI can interfere with vision, memory, concentration, speech, balance, sense of touch, and sleep. TBI can also cause chronic symptoms like headache or dizziness. A mild TBI is also known as a concussion. A concussion usually involves being knocked-out (losing consciousness) for a short time or not at all, and results in less brain damage. A moderate or severe TBI involves losing consciousness for an extended period of time. This can result in more serious damage to the brain, such as memory loss after the injury. What do I need to know about my loved one's recovery? Traumatic brain injuries affect different people in different ways. It is important to understand that recovery is different for each person, and may take time. Mild injury Many people with a mild brain injury recover quickly and return to their normal activities and abilities. However, some people may continue to experience problems associated with their brain injury. This can cause frustration, anger, and moodiness for the person and their loved ones, especially if they expected a normal and complete recovery. Moderate or severe injury After a moderate or severe injury, there is usually an initial recovery period that may last up to   several weeks. During this time, your loved one may stay in the hospital or a rehabilitation center. Depending on your loved one's injury, age, recovery, and overall health, he or she may:  Be allowed to return home or enter a long-term care facility.  Need to continue rehabilitation with occupational, physical, and speech therapists to help restore their mobility and their ability to process information, speak, and do daily activities.  Have changes to mood and personality.  Experience changes to sleep, energy levels, appetite, balance, bladder control, and vision.  Have long-term health complications, such as seizures,  headaches, or chronic pain.  Need to take an extended time off work or school. Some people are not able to return to these activities after their injury. How can I support my friend or family member? General help  Offer assistance with household chores or other daily tasks. Cook or make "freezer meals" that can be reheated.  Help your loved one establish a daily routine. This may include setting reminders or having a shared day planner or calendar.  Encourage rest. Your loved one may need frequent breaks during social situations or other activities.  Be patient. Your loved one may take longer to complete tasks and process information.  When giving instructions, give only one instruction at a time or make lists. Multitasking can be difficult for a person with a TBI.  Do not set expectations about your loved one's recovery. Medical appointments  Gently remind your loved one of tasks or appointments if he or she is forgetful.  Provide transportation to and from appointments.  Attend rehabilitation appointments with your loved one. Help with exercises at home. Preventing falls Take steps to prevent falls in your loved one's home. This may include:  Installing grab bars in the bathroom or handrails on stairs.  Using night lights in the bedroom, bathroom, or hallways.  Securing or removing rugs and cords in walkways. Managing finances  Help manage finances. Talk with a social worker or legal professional if: ? Your loved one is unable to return to work and is in need of financial help. ? You need help paying medical bills. ? You need to establish guardianship over finances. ? You need help with estate planning. How can I care for myself? Lifestyle   Do not use drugs.  Avoid or limit alcohol use. Limit alcohol intake to no more than 1 drink per day for nonpregnant women and 2 drinks per day for men. One drink equals 12 ounces of beer, 5 ounces of wine, or 1 ounces of hard  liquor.  Rest. Try to get 7-9 hours of uninterrupted sleep each night.  Eat a balanced diet that includes fresh fruits and vegetables, whole grains, lean proteins, and low-fat dairy.  Exercise for at least 30 minutes on 5 or more days each week.  Find ways to manage stress. This may include: ? Deep breathing, yoga, or meditation. ? Spending time outdoors. ? Journaling. Finding support  Ask for help. Take a break if you are the primary caregiver to your loved one.  Spend time with supportive people.  Join a support group with other caregivers or family members of people with TBI.  If you experience new or worsening depression or anxiety, seek counseling from a mental health professional. Where to find more information  Learn more about TBI and find support through: ? Brainline: www.brainline.org ? [employer]: www.biausa.org  For military servicemen and their families: ? [facility]:   dvbic.dcoe.mil ? [employer] Military and Veterans Crisis Line: 1-800-273-8255 Summary  A Traumatic brain injury (TBI) is an injury to the brain that can interfere with vision, memory, concentration, speech, balance, sense of touch, and sleep. TBI can also cause chronic symptoms like headache or dizziness.  Traumatic brain injuries affect different people in different ways. It is important to understand that recovery takes time and looks different for each person.  You can support your loved one with a TBI by assisting with daily tasks, setting reminders, encouraging rest, and being patient.  It is important to take care of yourself as a caregiver. Take time to exercise, manage stress, spend time with supportive people, and rest. Ask for help, find a support group, and meet with a mental health counselor, if needed. This information is not intended to replace advice given to you by your health care provider. Make sure you  discuss any questions you have with your health care provider. Document Revised: 06/23/2018 Document Reviewed: 02/28/2016 Elsevier Patient Education  2020 [employer]  
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
## 2021-01-19 IMAGING — DX DG FOREARM 2V*L*
1 series · 2 of 2 positions shown · non-contrast
Comparison: Radiographs 04/03/2019.

CLINICAL DATA: Postop fixation of left forearm fractures.

EXAM:
LEFT FOREARM - 2 VIEW

[Series 1: forearmbone · 0.14mm/px · 2 of 2 slices shown]
[im 1/2]
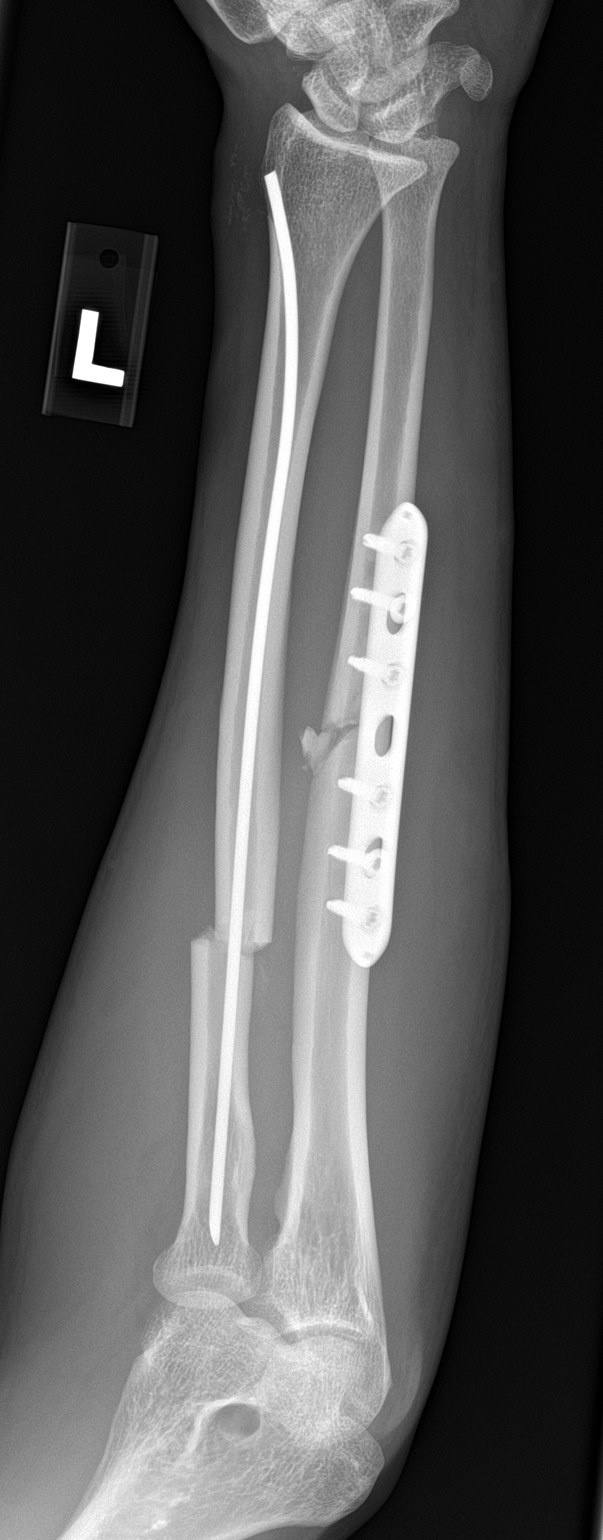
[im 2/2]
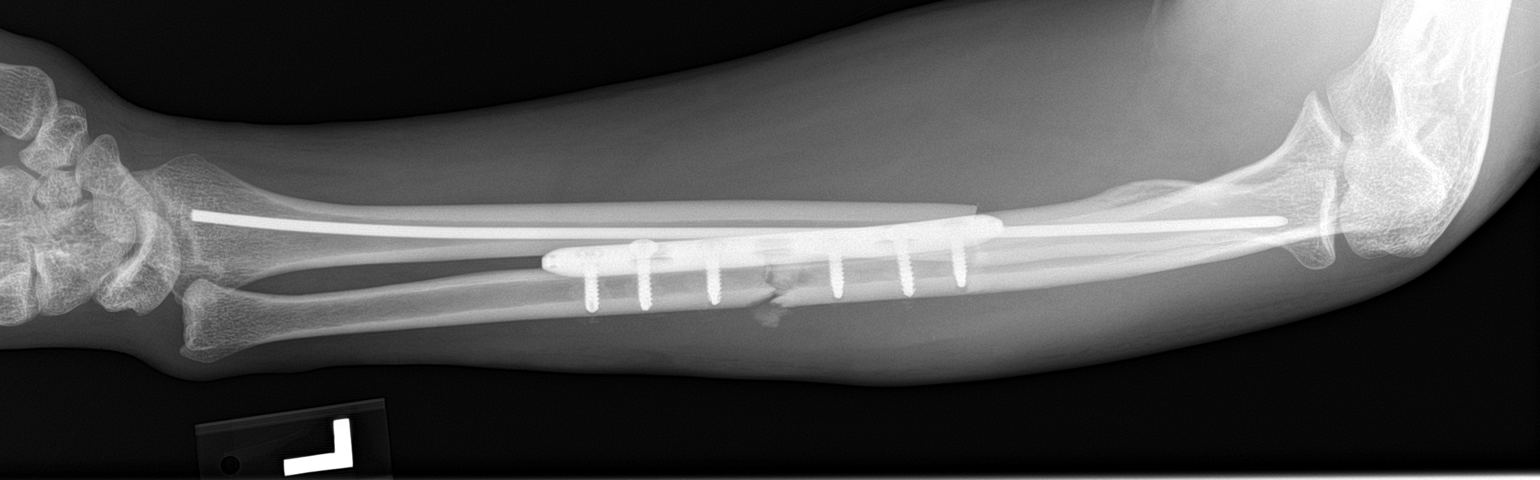

[2 of 2 positions shown; findings below may reference images not displayed]

FINDINGS: Status post plate and screw fixation of the comminuted mid ulnar
diaphyseal fracture. The hardware is intact. The alignment is
stable. There is stable ulnar displacement of the proximal radial
fracture by 5 mm status post rod fixation. No bridging callus
formation identified. There is no dislocation at the elbow or wrist.
IMPRESSION: Intact hardware and stable alignment status post ORIF of proximal
radial and mid ulnar fractures.

## 2021-01-20 IMAGING — DX DG PELVIS 1-2V
1 series · 1 of 1 positions shown · non-contrast
Comparison: April 02, 2019.

CLINICAL DATA: Left iliac fracture.

EXAM:
PELVIS - 1-2 VIEW

[pelvis ap]
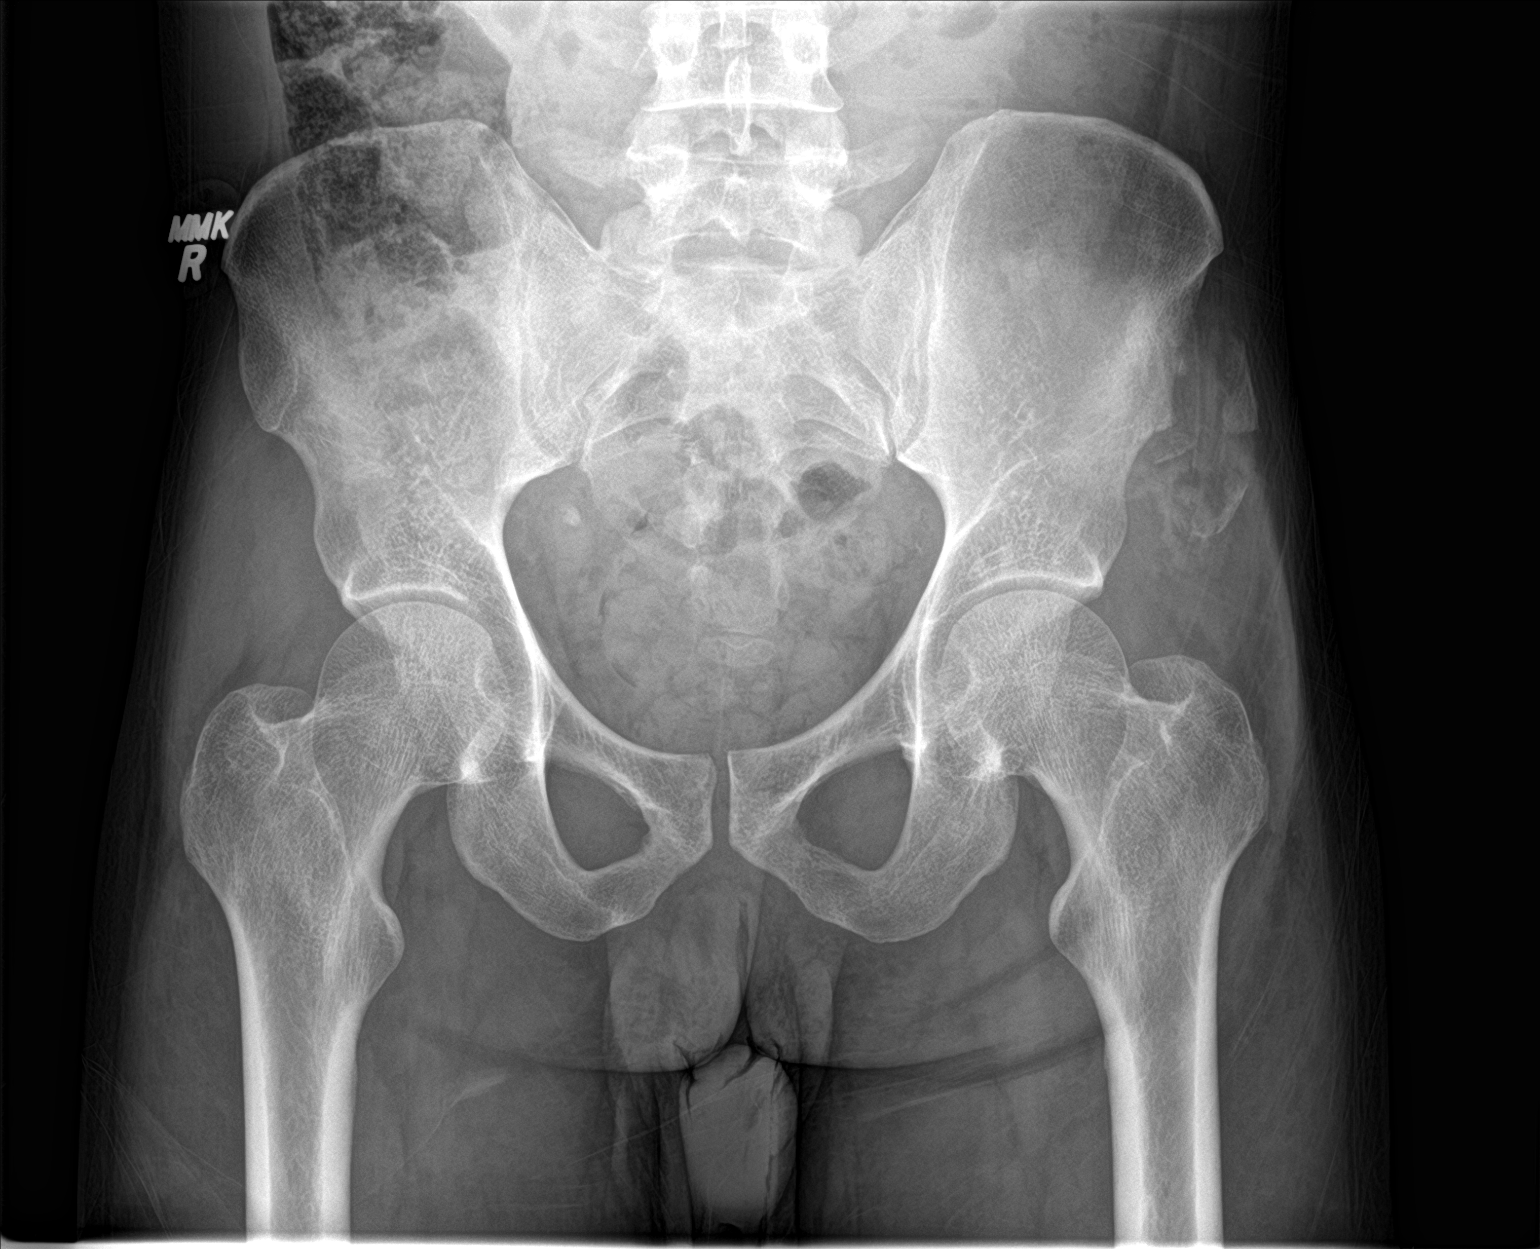

[1 of 1 positions shown; findings below may reference images not displayed]

FINDINGS: Comminuted fracture involving the left iliac wing is again noted,
although callus formation is now seen suggesting some healing. No
other bony abnormality is noted. Hip and sacroiliac joints are
unremarkable. No pelvic bone lesions are seen.
IMPRESSION: Comminuted left iliac wing fracture is again noted, although callus
formation is now seen suggesting some healing.
